# Patient Record
Sex: Female | Born: 1950
Health system: Southern US, Community
[De-identification: ages and names within clinical notes are randomized; demographics above are authoritative.]

## PROBLEM LIST (undated history)

## (undated) DIAGNOSIS — K219 Gastro-esophageal reflux disease without esophagitis: Secondary | ICD-10-CM

## (undated) DIAGNOSIS — E7800 Pure hypercholesterolemia, unspecified: Secondary | ICD-10-CM

## (undated) DIAGNOSIS — M810 Age-related osteoporosis without current pathological fracture: Secondary | ICD-10-CM

## (undated) DIAGNOSIS — H269 Unspecified cataract: Secondary | ICD-10-CM

## (undated) DIAGNOSIS — C187 Malignant neoplasm of sigmoid colon: Secondary | ICD-10-CM

## (undated) DIAGNOSIS — N200 Calculus of kidney: Secondary | ICD-10-CM

## (undated) DIAGNOSIS — M1990 Unspecified osteoarthritis, unspecified site: Secondary | ICD-10-CM

## (undated) DIAGNOSIS — C50919 Malignant neoplasm of unspecified site of unspecified female breast: Secondary | ICD-10-CM

## (undated) DIAGNOSIS — F419 Anxiety disorder, unspecified: Secondary | ICD-10-CM

## (undated) DIAGNOSIS — C189 Malignant neoplasm of colon, unspecified: Secondary | ICD-10-CM

## (undated) DIAGNOSIS — I10 Essential (primary) hypertension: Secondary | ICD-10-CM

## (undated) HISTORY — PX: ABDOMINAL HYSTERECTOMY: SHX81

## (undated) HISTORY — PX: TONSILLECTOMY AND ADENOIDECTOMY: SUR1326

## (undated) HISTORY — PX: COLONOSCOPY: SHX174

## (undated) HISTORY — DX: Essential (primary) hypertension: I10

## (undated) HISTORY — DX: Anxiety disorder, unspecified: F41.9

## (undated) HISTORY — PX: BREAST LUMPECTOMY: SHX2

## (undated) HISTORY — DX: Pure hypercholesterolemia, unspecified: E78.00

## (undated) HISTORY — DX: Age-related osteoporosis without current pathological fracture: M81.0

## (undated) HISTORY — DX: Unspecified osteoarthritis, unspecified site: M19.90

## (undated) HISTORY — DX: Malignant neoplasm of unspecified site of unspecified female breast: C50.919

## (undated) HISTORY — PX: KNEE ARTHROSCOPY: SUR90

## (undated) HISTORY — DX: Unspecified cataract: H26.9

## (undated) HISTORY — PX: POLYPECTOMY: SHX149

## (undated) HISTORY — PX: TONSILECTOMY, ADENOIDECTOMY, BILATERAL MYRINGOTOMY AND TUBES: SHX2538

## (undated) HISTORY — DX: Malignant neoplasm of sigmoid colon: C18.7

## (undated) HISTORY — DX: Calculus of kidney: N20.0

## (undated) HISTORY — DX: Gastro-esophageal reflux disease without esophagitis: K21.9

---
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
## 1998-09-02 DIAGNOSIS — C439 Malignant melanoma of skin, unspecified: Secondary | ICD-10-CM

## 1998-09-02 HISTORY — DX: Malignant melanoma of skin, unspecified: C43.9

## 2001-02-11 ENCOUNTER — Other Ambulatory Visit: Admission: RE | Admit: 2001-02-11 | Discharge: 2001-02-11 | Payer: Self-pay | Admitting: Obstetrics and Gynecology

## 2001-09-02 DIAGNOSIS — C50919 Malignant neoplasm of unspecified site of unspecified female breast: Secondary | ICD-10-CM

## 2001-09-02 DIAGNOSIS — I10 Essential (primary) hypertension: Secondary | ICD-10-CM | POA: Insufficient documentation

## 2001-09-02 HISTORY — DX: Malignant neoplasm of unspecified site of unspecified female breast: C50.919

## 2001-09-02 HISTORY — DX: Essential (primary) hypertension: I10

## 2002-08-04 ENCOUNTER — Encounter: Payer: Self-pay | Admitting: Surgery

## 2002-08-04 ENCOUNTER — Encounter: Admission: RE | Admit: 2002-08-04 | Discharge: 2002-08-04 | Payer: Self-pay | Admitting: Surgery

## 2002-08-06 ENCOUNTER — Encounter: Admission: RE | Admit: 2002-08-06 | Discharge: 2002-08-06 | Payer: Self-pay | Admitting: Surgery

## 2002-08-06 ENCOUNTER — Encounter (INDEPENDENT_AMBULATORY_CARE_PROVIDER_SITE_OTHER): Payer: Self-pay | Admitting: *Deleted

## 2002-08-06 ENCOUNTER — Encounter: Payer: Self-pay | Admitting: Surgery

## 2002-08-06 ENCOUNTER — Ambulatory Visit (HOSPITAL_BASED_OUTPATIENT_CLINIC_OR_DEPARTMENT_OTHER): Admission: RE | Admit: 2002-08-06 | Discharge: 2002-08-06 | Payer: Self-pay | Admitting: Surgery

## 2002-08-17 ENCOUNTER — Ambulatory Visit: Admission: RE | Admit: 2002-08-17 | Discharge: 2002-09-13 | Payer: Self-pay | Admitting: Radiation Oncology

## 2002-11-26 ENCOUNTER — Ambulatory Visit: Admission: RE | Admit: 2002-11-26 | Discharge: 2003-02-16 | Payer: Self-pay | Admitting: Radiation Oncology

## 2003-08-16 ENCOUNTER — Ambulatory Visit: Admission: RE | Admit: 2003-08-16 | Discharge: 2003-08-16 | Payer: Self-pay | Admitting: Radiation Oncology

## 2004-08-02 ENCOUNTER — Ambulatory Visit: Payer: Self-pay | Admitting: Oncology

## 2005-02-22 ENCOUNTER — Ambulatory Visit: Payer: Self-pay | Admitting: Oncology

## 2005-08-09 ENCOUNTER — Ambulatory Visit: Payer: Self-pay | Admitting: Oncology

## 2006-02-03 ENCOUNTER — Ambulatory Visit: Payer: Self-pay | Admitting: Oncology

## 2006-08-14 ENCOUNTER — Ambulatory Visit: Payer: Self-pay | Admitting: Oncology

## 2011-01-18 NOTE — Op Note (Signed)
NAME:  Stephanie Leblanc, Stephanie Leblanc                          ACCOUNT NO.:  [id number]   MEDICAL RECORD NO.:  [id number]                   PATIENT TYPE:  AMB   LOCATION:  DSC                                  FACILITY:  [facility]   PHYSICIAN:  Currie Paris, M.D.           DATE OF BIRTH:  [birth year]   DATE OF PROCEDURE:  DATE OF DISCHARGE:                                 OPERATIVE REPORT   Office MRN [id number]   PREOPERATIVE DIAGNOSIS:  Carcinoma of right breast, lower outer quadrant.   POSTOPERATIVE DIAGNOSIS:  Carcinoma of right breast, lower outer quadrant.   OPERATION:  Needle guided excision of right breast cancer with blue dye  injection and sentinel node dissection.   SURGEON:  Currie Paris, M.D.   ANESTHESIA:  General.   CLINICAL HISTORY:  The patient is a 60 year-old who was recently found to  have a breast cancer by biopsy.  After discussion of alternatives with the  patient, we elected to proceed to needle guided excision followed by  sentinel node dissection using both blue dye and technetium.   DESCRIPTION OF PROCEDURE:  The patient was seen in the holding area and had  no further questions. A guide wire had been placed appropriately and entered  in the lower outer quadrant of the right breast to track medially.  She had  already been injected with her technetium.  She had no further questions  regarding her surgery.   The patient was taken to the operating room and after satisfactory general  endotracheal anesthesia had been obtained the breast was prepped and draped.  Five cc of Lymphazurin blue was injected subareolarly and massaged.   I initially approached the guide wire lumpectomy first and began an incision  in the right lower part of the breast and tracked it towards the nipple  areolar complex which was the general direction of the guide wire although  it seemed to go straight into the breast.  I went around it both inferiorly  and superiorly, then got under  it down to the chest wall and then continued  my tracking it medially toward the subareolar area which was where the mass  turned out to be. I felt I got a little close to the mass along the lower  margin or the inferior margin, so I had to back out and move around a little  bit. I felt the rest of the margins looked fine.  This was a very irregular,  hard area.  She had a lot of dense fibrocystic type changes subareolarly  consistent with some ductal ectasia.  Because I thought one margin might  well be very close and since I had good visualization to where that margin  was, I went ahead and took another area of breast tissue to get a cm more  margin completely around the area where I thought the margin would be close.  Bleeders were  electrocoagulated and once everything appeared to be dry, I  put some Marcaine in.  I put some clips in using four small clips to mark  the periphery and two large clips to mark the anterior and posteriori  margins of the lumpectomy.  The tumor was actually fairly medially located  almost subareolarly.  A pack was placed while waiting for specimen  mammography.   Attention was turned to the axillary area and I was able to immediately  identify a hot area with the Neoprobe and made a transverse incision there.  As soon as I got through the subcutaneous tissue I saw a blue lymphatic and  using the Neoprobe I felt that the node was a little bit higher than that,  so I dissected a little bit more along the track of the lymphatic and  immediately found a large blue, soft node with a little firm node tissue  right adjacent to it and both came out as a single specimen. I was not sure  whether this was just one node or two fused together.  This went to  pathology as a hot blue node.  It had counts around 900 and as soon as that  was removed all the counts in the remaining axillary were down below about  20.  There was no additional palpable adenopathy noted nor any  additional  blue nodes noted.   Both incisions were closed while waiting for pathology using 3-0 Vicryl to  close the subcutaneous at the lumpectomy site plus 4-0 Monocryl  subcuticular.  The axillary site was closed similarly.  I injected some  Marcaine in both sites to help with postoperative analgesia.  I spoke with  Dr. Clelia Croft who felt that the sentinel node was negative on touch preps.  Pathology felt we had the tumor well out and I felt with the additional  margin there really was not more that I would want to do today.  I felt that  any further margin would probably require a mastectomy or certainly more of  a quadrantectomy than what we had done today.  I felt this would be likely  to leave some significant deformity.   The patient tolerated the procedure well.  There were no operative  complications and all counts were correct.                                               Currie Paris, M.D.    CJS/MEDQ  D:  08/06/2002  T:  08/06/2002  Job:  [id number]   cc:   Malachi Pro. Ambrose Mantle, M.D.  [address]  [place]  [place] [address]  Fax: [phone]   Cliffton Asters, M.D.  [address] [place]  [place] [address]  Fax: [phone]   Jeralyn Ruths, M.D.   Leodis Liverpool, M.D.

## 2015-07-20 DIAGNOSIS — Z853 Personal history of malignant neoplasm of breast: Secondary | ICD-10-CM

## 2015-11-08 DIAGNOSIS — L659 Nonscarring hair loss, unspecified: Secondary | ICD-10-CM | POA: Diagnosis not present

## 2015-11-08 DIAGNOSIS — Z1329 Encounter for screening for other suspected endocrine disorder: Secondary | ICD-10-CM | POA: Diagnosis not present

## 2015-11-08 DIAGNOSIS — R5383 Other fatigue: Secondary | ICD-10-CM | POA: Diagnosis not present

## 2015-11-08 DIAGNOSIS — E876 Hypokalemia: Secondary | ICD-10-CM | POA: Diagnosis not present

## 2015-12-21 DIAGNOSIS — I10 Essential (primary) hypertension: Secondary | ICD-10-CM | POA: Diagnosis not present

## 2016-01-11 DIAGNOSIS — L659 Nonscarring hair loss, unspecified: Secondary | ICD-10-CM | POA: Diagnosis not present

## 2016-01-11 DIAGNOSIS — L820 Inflamed seborrheic keratosis: Secondary | ICD-10-CM | POA: Diagnosis not present

## 2016-01-11 DIAGNOSIS — Z08 Encounter for follow-up examination after completed treatment for malignant neoplasm: Secondary | ICD-10-CM | POA: Diagnosis not present

## 2016-01-11 DIAGNOSIS — Z85828 Personal history of other malignant neoplasm of skin: Secondary | ICD-10-CM | POA: Diagnosis not present

## 2016-01-12 DIAGNOSIS — L632 Ophiasis: Secondary | ICD-10-CM | POA: Diagnosis not present

## 2016-03-06 DIAGNOSIS — H1132 Conjunctival hemorrhage, left eye: Secondary | ICD-10-CM | POA: Diagnosis not present

## 2016-04-01 DIAGNOSIS — H5203 Hypermetropia, bilateral: Secondary | ICD-10-CM | POA: Diagnosis not present

## 2016-04-01 DIAGNOSIS — H2513 Age-related nuclear cataract, bilateral: Secondary | ICD-10-CM | POA: Diagnosis not present

## 2016-08-01 DIAGNOSIS — Z853 Personal history of malignant neoplasm of breast: Secondary | ICD-10-CM | POA: Diagnosis not present

## 2016-08-01 DIAGNOSIS — Z1231 Encounter for screening mammogram for malignant neoplasm of breast: Secondary | ICD-10-CM | POA: Diagnosis not present

## 2016-10-03 DIAGNOSIS — Z85820 Personal history of malignant melanoma of skin: Secondary | ICD-10-CM | POA: Diagnosis not present

## 2016-10-03 DIAGNOSIS — Z08 Encounter for follow-up examination after completed treatment for malignant neoplasm: Secondary | ICD-10-CM | POA: Diagnosis not present

## 2016-10-03 DIAGNOSIS — L659 Nonscarring hair loss, unspecified: Secondary | ICD-10-CM | POA: Diagnosis not present

## 2016-12-12 DIAGNOSIS — I10 Essential (primary) hypertension: Secondary | ICD-10-CM | POA: Diagnosis not present

## 2016-12-12 DIAGNOSIS — F341 Dysthymic disorder: Secondary | ICD-10-CM | POA: Diagnosis not present

## 2017-01-31 DIAGNOSIS — I10 Essential (primary) hypertension: Secondary | ICD-10-CM | POA: Diagnosis not present

## 2017-01-31 DIAGNOSIS — F341 Dysthymic disorder: Secondary | ICD-10-CM | POA: Diagnosis not present

## 2017-01-31 DIAGNOSIS — R5383 Other fatigue: Secondary | ICD-10-CM | POA: Diagnosis not present

## 2017-02-26 DIAGNOSIS — Z6827 Body mass index (BMI) 27.0-27.9, adult: Secondary | ICD-10-CM | POA: Diagnosis not present

## 2017-02-26 DIAGNOSIS — J00 Acute nasopharyngitis [common cold]: Secondary | ICD-10-CM | POA: Diagnosis not present

## 2017-02-26 DIAGNOSIS — R05 Cough: Secondary | ICD-10-CM | POA: Diagnosis not present

## 2017-02-26 DIAGNOSIS — R0981 Nasal congestion: Secondary | ICD-10-CM | POA: Diagnosis not present

## 2017-08-15 DIAGNOSIS — Z1231 Encounter for screening mammogram for malignant neoplasm of breast: Secondary | ICD-10-CM | POA: Diagnosis not present

## 2017-08-15 DIAGNOSIS — Z853 Personal history of malignant neoplasm of breast: Secondary | ICD-10-CM | POA: Diagnosis not present

## 2017-08-15 DIAGNOSIS — M8589 Other specified disorders of bone density and structure, multiple sites: Secondary | ICD-10-CM | POA: Diagnosis not present

## 2017-08-15 DIAGNOSIS — R928 Other abnormal and inconclusive findings on diagnostic imaging of breast: Secondary | ICD-10-CM | POA: Diagnosis not present

## 2017-08-30 DIAGNOSIS — J019 Acute sinusitis, unspecified: Secondary | ICD-10-CM | POA: Diagnosis not present

## 2017-09-02 DIAGNOSIS — C189 Malignant neoplasm of colon, unspecified: Secondary | ICD-10-CM | POA: Insufficient documentation

## 2017-09-02 DIAGNOSIS — E7800 Pure hypercholesterolemia, unspecified: Secondary | ICD-10-CM | POA: Insufficient documentation

## 2017-09-02 HISTORY — DX: Pure hypercholesterolemia, unspecified: E78.00

## 2017-09-02 HISTORY — DX: Malignant neoplasm of colon, unspecified: C18.9

## 2017-09-04 DIAGNOSIS — M8580 Other specified disorders of bone density and structure, unspecified site: Secondary | ICD-10-CM | POA: Diagnosis not present

## 2017-09-04 DIAGNOSIS — Z853 Personal history of malignant neoplasm of breast: Secondary | ICD-10-CM | POA: Diagnosis not present

## 2017-10-10 DIAGNOSIS — L814 Other melanin hyperpigmentation: Secondary | ICD-10-CM | POA: Diagnosis not present

## 2017-10-10 DIAGNOSIS — L738 Other specified follicular disorders: Secondary | ICD-10-CM | POA: Diagnosis not present

## 2017-10-10 DIAGNOSIS — D225 Melanocytic nevi of trunk: Secondary | ICD-10-CM | POA: Diagnosis not present

## 2017-10-10 DIAGNOSIS — Z08 Encounter for follow-up examination after completed treatment for malignant neoplasm: Secondary | ICD-10-CM | POA: Diagnosis not present

## 2017-10-10 DIAGNOSIS — Z85820 Personal history of malignant melanoma of skin: Secondary | ICD-10-CM | POA: Diagnosis not present

## 2017-10-10 DIAGNOSIS — L821 Other seborrheic keratosis: Secondary | ICD-10-CM | POA: Diagnosis not present

## 2017-10-10 DIAGNOSIS — Z85828 Personal history of other malignant neoplasm of skin: Secondary | ICD-10-CM | POA: Diagnosis not present

## 2017-10-10 DIAGNOSIS — L820 Inflamed seborrheic keratosis: Secondary | ICD-10-CM | POA: Diagnosis not present

## 2017-11-11 DIAGNOSIS — I10 Essential (primary) hypertension: Secondary | ICD-10-CM | POA: Diagnosis not present

## 2017-11-12 DIAGNOSIS — Z79899 Other long term (current) drug therapy: Secondary | ICD-10-CM | POA: Diagnosis not present

## 2017-11-12 DIAGNOSIS — I10 Essential (primary) hypertension: Secondary | ICD-10-CM | POA: Diagnosis not present

## 2017-11-12 DIAGNOSIS — J9811 Atelectasis: Secondary | ICD-10-CM | POA: Diagnosis not present

## 2017-11-12 DIAGNOSIS — R42 Dizziness and giddiness: Secondary | ICD-10-CM | POA: Diagnosis not present

## 2017-11-13 DIAGNOSIS — I10 Essential (primary) hypertension: Secondary | ICD-10-CM | POA: Diagnosis not present

## 2017-11-13 DIAGNOSIS — Z79899 Other long term (current) drug therapy: Secondary | ICD-10-CM | POA: Diagnosis not present

## 2017-11-13 DIAGNOSIS — J9811 Atelectasis: Secondary | ICD-10-CM | POA: Diagnosis not present

## 2017-11-13 DIAGNOSIS — R42 Dizziness and giddiness: Secondary | ICD-10-CM | POA: Diagnosis not present

## 2017-11-25 DIAGNOSIS — I10 Essential (primary) hypertension: Secondary | ICD-10-CM | POA: Diagnosis not present

## 2017-11-25 DIAGNOSIS — Z6830 Body mass index (BMI) 30.0-30.9, adult: Secondary | ICD-10-CM | POA: Diagnosis not present

## 2017-12-09 DIAGNOSIS — Z1212 Encounter for screening for malignant neoplasm of rectum: Secondary | ICD-10-CM | POA: Diagnosis not present

## 2017-12-09 DIAGNOSIS — Z1211 Encounter for screening for malignant neoplasm of colon: Secondary | ICD-10-CM | POA: Diagnosis not present

## 2017-12-20 LAB — COLOGUARD

## 2017-12-30 ENCOUNTER — Encounter: Payer: Self-pay | Admitting: Physician Assistant

## 2018-01-08 DIAGNOSIS — R69 Illness, unspecified: Secondary | ICD-10-CM | POA: Diagnosis not present

## 2018-01-08 DIAGNOSIS — E785 Hyperlipidemia, unspecified: Secondary | ICD-10-CM | POA: Diagnosis not present

## 2018-01-08 DIAGNOSIS — Z6830 Body mass index (BMI) 30.0-30.9, adult: Secondary | ICD-10-CM | POA: Diagnosis not present

## 2018-01-08 DIAGNOSIS — I10 Essential (primary) hypertension: Secondary | ICD-10-CM | POA: Diagnosis not present

## 2018-01-19 ENCOUNTER — Encounter: Payer: Self-pay | Admitting: *Deleted

## 2018-01-19 ENCOUNTER — Encounter: Payer: Self-pay | Admitting: Physician Assistant

## 2018-01-19 ENCOUNTER — Ambulatory Visit: Payer: Medicare HMO | Admitting: Physician Assistant

## 2018-01-19 VITALS — BP 130/72 | HR 72 | Ht 63.0 in | Wt 174.8 lb

## 2018-01-19 DIAGNOSIS — Z853 Personal history of malignant neoplasm of breast: Secondary | ICD-10-CM

## 2018-01-19 DIAGNOSIS — K219 Gastro-esophageal reflux disease without esophagitis: Secondary | ICD-10-CM | POA: Diagnosis not present

## 2018-01-19 DIAGNOSIS — I10 Essential (primary) hypertension: Secondary | ICD-10-CM

## 2018-01-19 DIAGNOSIS — R195 Other fecal abnormalities: Secondary | ICD-10-CM

## 2018-01-19 HISTORY — DX: Personal history of malignant neoplasm of breast: Z85.3

## 2018-01-19 HISTORY — DX: Essential (primary) hypertension: I10

## 2018-01-19 MED ORDER — PEG 3350-KCL-NA BICARB-NACL 420 G PO SOLR: ORAL | 0 refills | Status: DC

## 2018-01-19 NOTE — Patient Instructions (Signed)
If you are age 68 or older, your body mass index should be between 23-30. Your There is no height or weight on file to calculate BMI. If this is out of the aforementioned range listed, please consider follow up with your Primary Care Provider.  You have been scheduled for an endoscopy and colonoscopy. Please follow the written instructions given to you at your visit today. Please pick up your prep supplies at the pharmacy within the next 1-3 days. [facility], [place], [place] [facility] [place].  If you use inhalers (even only as needed), please bring them with you on the day of your procedure.

## 2018-01-19 NOTE — Progress Notes (Signed)
Subjective:    Patient ID: Stephanie Leblanc, female    DOB: [birth year], 67 y.o.   MRN: [id number]  HPI Stephanie Leblanc is a very nice 67 year old white female, new to GI today referred by Lucita Lora, NP/[facility] for evaluation of positive Cologuard. Patient has not had any prior colonoscopic evaluation.  Family history is positive for polyps, negative for colon cancer. Patient has history of breast cancer diagnosed in 2003 which has been in remission, hypertension, and chronic GERD. She has no current complaints of abdominal pain or discomfort, no changes in bowel habits no melena or hematochezia.  She has been on Protonix 40 mg p.o. daily since 2003 for chronic GERD.  She says generally this controls her symptoms well.  She has no complaints of dysphasia or odynophagia.  She has not had prior EGD.  Review of Systems Pertinent positive and negative review of systems were noted in the above HPI section.  All other review of systems was otherwise negative.  Outpatient Encounter Medications as of 01/19/2018  Medication Sig  . polyethylene glycol-electrolytes (NULYTELY/GOLYTELY) 420 g solution Take as directed for colonoscopy.   No facility-administered encounter medications on file as of 01/19/2018.    No Known Allergies Patient Active Problem List   Diagnosis Date Noted  . Hx of breast cancer 01/19/2018  . HTN (hypertension) 01/19/2018  . GERD (gastroesophageal reflux disease) 01/19/2018   Social History   Socioeconomic History  . Marital status: Single    Spouse name: Not on file  . Number of children: Not on file  . Years of education: Not on file  . Highest education level: Not on file  Occupational History  . Not on file  Social Needs  . Financial resource strain: Not on file  . Food insecurity:    Worry: Not on file    Inability: Not on file  . Transportation needs:    Medical: Not on file    Non-medical: Not on file  Tobacco Use  .  Smoking status: Not on file  Substance and Sexual Activity  . Alcohol use: Not on file  . Drug use: Not on file  . Sexual activity: Not on file  Lifestyle  . Physical activity:    Days per week: Not on file    Minutes per session: Not on file  . Stress: Not on file  Relationships  . Social connections:    Talks on phone: Not on file    Gets together: Not on file    Attends religious service: Not on file    Active member of club or organization: Not on file    Attends meetings of clubs or organizations: Not on file    Relationship status: Not on file  . Intimate partner violence:    Fear of current or ex partner: Not on file    Emotionally abused: Not on file    Physically abused: Not on file    Forced sexual activity: Not on file  Other Topics Concern  . Not on file  Social History Narrative  . Not on file    Stephanie Leblanc's family history includes Diabetes in her mother; Heart disease in her father and mother.      Objective:    Vitals:   01/19/18 1019  BP: 130/72  Pulse: 72    Physical Exam; well-developed older white female in no acute distress, very pleasant accompanied by her husband blood pressure 130/72, pulse 72, height 5 foot 3,  weight 174.  HEENT; nontraumatic normocephalic EOMI PERRLA sclera anicteric, Oropharynx clear, Pulmonary ;clear bilaterally, Cardiovascular; regular rate and rhythm with S1-S2 no murmur rub or gallop, Abdomen; soft, nontender nondistended bowel sounds are active there is no palpable mass or hepatosplenomegaly, Rectal; exam not done, Extremities; no clubbing cyanosis or edema skin warm and dry, Neuro psych; alert and oriented, grossly nonfocal mood and affect appropriate       Assessment & Plan:   #73 67 year old white female referred for positive Cologuard.  She is currently asymptomatic and average risk for colon cancer.  #2.  Personal history of breast cancer 2003/in remission #3.  Hypertension #4.  Chronic GERD-on PPI therapy for  greater than 15 years- rule out Barrett's esophagus  Plan; Patient will be scheduled for colonoscopy and EGD with Dr. Ardis Hughs.  Patient requests being established with Dr. Ardis Hughs as her husband is a patient of Dr. Ardis Hughs.  Procedures were discussed in detail with the patient including indications risks and benefits and she is agreeable to proceed. She will continue Protonix 40 mg p.o. every morning.  Lizet Kelso S Parth Mccormac PA-C 01/19/2018   Cc: Charlynn Court, NP

## 2018-01-20 NOTE — Progress Notes (Signed)
I agree with the above note, plan 

## 2018-02-11 ENCOUNTER — Encounter: Payer: Self-pay | Admitting: Gastroenterology

## 2018-02-24 ENCOUNTER — Other Ambulatory Visit: Payer: Self-pay

## 2018-02-24 ENCOUNTER — Encounter

## 2018-02-24 ENCOUNTER — Ambulatory Visit (AMBULATORY_SURGERY_CENTER): Payer: Medicare HMO | Admitting: Gastroenterology

## 2018-02-24 ENCOUNTER — Encounter: Payer: Self-pay | Admitting: Gastroenterology

## 2018-02-24 VITALS — BP 141/58 | HR 67 | Temp 97.7°F | Resp 14 | Ht 63.0 in | Wt 174.0 lb

## 2018-02-24 DIAGNOSIS — C187 Malignant neoplasm of sigmoid colon: Secondary | ICD-10-CM | POA: Diagnosis not present

## 2018-02-24 DIAGNOSIS — K219 Gastro-esophageal reflux disease without esophagitis: Secondary | ICD-10-CM

## 2018-02-24 DIAGNOSIS — D125 Benign neoplasm of sigmoid colon: Secondary | ICD-10-CM

## 2018-02-24 DIAGNOSIS — R195 Other fecal abnormalities: Secondary | ICD-10-CM

## 2018-02-24 DIAGNOSIS — Z1211 Encounter for screening for malignant neoplasm of colon: Secondary | ICD-10-CM | POA: Diagnosis not present

## 2018-02-24 DIAGNOSIS — K449 Diaphragmatic hernia without obstruction or gangrene: Secondary | ICD-10-CM | POA: Diagnosis not present

## 2018-02-24 MED ORDER — SODIUM CHLORIDE 0.9 % IV SOLN: 500.0000 mL | Freq: Once | INTRAVENOUS | Status: DC

## 2018-02-24 NOTE — Progress Notes (Signed)
Called to room to assist during endoscopic procedure.  Patient ID and intended procedure confirmed with present staff. Received instructions for my participation in the procedure from the performing physician.  

## 2018-02-24 NOTE — Op Note (Signed)
[facility] Patient Name: Stephanie Leblanc Procedure Date: 02/24/2018 2:47 PM MRN: [id number] Endoscopist: Milus Banister , MD Age: 66 Referring MD:  Date of Birth: April 19, 1951 Gender: Female Account #: [id number] Procedure:                Colonoscopy Indications:              Positive Cologuard test Medicines:                Monitored Anesthesia Care Procedure:                Pre-Anesthesia Assessment:                           - Prior to the procedure, a History and Physical                            was performed, and patient medications and                            allergies were reviewed. The patient's tolerance of                            previous anesthesia was also reviewed. The risks                            and benefits of the procedure and the sedation                            options and risks were discussed with the patient.                            All questions were answered, and informed consent                            was obtained. Prior Anticoagulants: The patient has                            taken no previous anticoagulant or antiplatelet                            agents. ASA Grade Assessment: II - A patient with                            mild systemic disease. After reviewing the risks                            and benefits, the patient was deemed in                            satisfactory condition to undergo the procedure.                           After obtaining informed consent, the colonoscope  was passed under direct vision. Throughout the                            procedure, the patient's blood pressure, pulse, and                            oxygen saturations were monitored continuously. The                            Colonoscope was introduced through the anus and                            advanced to the the cecum, identified by                            appendiceal orifice and ileocecal valve. The                             colonoscopy was performed without difficulty. The                            patient tolerated the procedure well. The quality                            of the bowel preparation was good. The ileocecal                            valve, appendiceal orifice, and rectum were                            photographed. Scope In: 2:53:16 PM Scope Out: 3:05:24 PM Scope Withdrawal Time: 0 hours 8 minutes 19 seconds  Total Procedure Duration: 0 hours 12 minutes 8 seconds  Findings:                 A 12 mm polyp was found in the sigmoid colon. The                            polyp was semi-pedunculated. The polyp was removed                            with a hot snare. Resection and retrieval were                            complete.                           The exam was otherwise without abnormality on                            direct and retroflexion views. Complications:            No immediate complications. Estimated blood loss:  None. Estimated Blood Loss:     Estimated blood loss: none. Impression:               - One 12 mm polyp in the sigmoid colon, removed                            with a hot snare. Resected and retrieved.                           - The examination was otherwise normal on direct                            and retroflexion views. Recommendation:           - Patient has a contact number available for                            emergencies. The signs and symptoms of potential                            delayed complications were discussed with the                            patient. Return to normal activities tomorrow.                            Written discharge instructions were provided to the                            patient.                           - Resume previous diet.                           - Continue present medications.                           You will receive a letter within 2-3 weeks with the                             pathology results and my final recommendations.                           If the polyp(s) is proven to be 'pre-cancerous' on                            pathology, you will need repeat colonoscopy in 3                            years. If the polyp(s) is NOT 'precancerous' on                            pathology then you should repeat colon cancer  screening in 10 years with colonoscopy without need                            for colon cancer screening by any method prior to                            then (including stool testing). Milus Banister, MD 02/24/2018 3:07:09 PM This report has been signed electronically.

## 2018-02-24 NOTE — Op Note (Signed)
[facility] Patient Name: Stephanie Leblanc Procedure Date: 02/24/2018 2:47 PM MRN: [id number] Endoscopist: Milus Banister , MD Age: 67 Referring MD:  Date of Birth: [birth year] Gender: Female Account #: [id number] Procedure:                Upper GI endoscopy Indications:              Heartburn Medicines:                Monitored Anesthesia Care Procedure:                Pre-Anesthesia Assessment:                           - Prior to the procedure, a History and Physical                            was performed, and patient medications and                            allergies were reviewed. The patient's tolerance of                            previous anesthesia was also reviewed. The risks                            and benefits of the procedure and the sedation                            options and risks were discussed with the patient.                            All questions were answered, and informed consent                            was obtained. Prior Anticoagulants: The patient has                            taken no previous anticoagulant or antiplatelet                            agents. ASA Grade Assessment: II - A patient with                            mild systemic disease. After reviewing the risks                            and benefits, the patient was deemed in                            satisfactory condition to undergo the procedure.                           After obtaining informed consent, the endoscope was  passed under direct vision. Throughout the                            procedure, the patient's blood pressure, pulse, and                            oxygen saturations were monitored continuously. The                            Endoscope was introduced through the mouth, and                            advanced to the second part of duodenum. The upper                            GI endoscopy was accomplished without  difficulty.                            The patient tolerated the procedure well. Scope In: Scope Out: Findings:                 The esophagus was normal.                           There was a small hiatal hernia.                           The examined duodenum was normal. Complications:            No immediate complications. Estimated blood loss:                            None. Estimated Blood Loss:     Estimated blood loss: none. Impression:               - Small hiatal hernia, otherwise the examination                            was normal. Recommendation:           - Patient has a contact number available for                            emergencies. The signs and symptoms of potential                            delayed complications were discussed with the                            patient. Return to normal activities tomorrow.                            Written discharge instructions were provided to the                            patient.                           -  Resume previous diet.                           - Continue present medications. Continue protonix                            one pill once daily for your non-erosive acid                            reflux. Milus Banister, MD 02/24/2018 3:14:18 PM This report has been signed electronically.

## 2018-02-24 NOTE — Progress Notes (Signed)
A and O x3. Report to RN. Tolerated MAC anesthesia well.Teeth unchanged after procedure.

## 2018-02-24 NOTE — Patient Instructions (Signed)
Colon polyp x 1 removed today. Hiatal hernia seen.  Handout given on polyps,GERD. Result letter in your mail in 2-3 weeks.  Please hold aspirin, NSAIDS, anti-inflammatory, Ibuprofen, Aleve, etc..medications for 2 weeks. Call us with any questions or concerns. Thank you!    YOU HAD AN ENDOSCOPIC PROCEDURE TODAY AT [facility]:   Refer to the procedure report that was given to you for any specific questions about what was found during the examination.  If the procedure report does not answer your questions, please call your gastroenterologist to clarify.  If you requested that your care partner not be given the details of your procedure findings, then the procedure report has been included in a sealed envelope for you to review at your convenience later.  YOU SHOULD EXPECT: Some feelings of bloating in the abdomen. Passage of more gas than usual.  Walking can help get rid of the air that was put into your GI tract during the procedure and reduce the bloating. If you had a lower endoscopy (such as a colonoscopy or flexible sigmoidoscopy) you may notice spotting of blood in your stool or on the toilet paper. If you underwent a bowel prep for your procedure, you may not have a normal bowel movement for a few days.  Please Note:  You might notice some irritation and congestion in your nose or some drainage.  This is from the oxygen used during your procedure.  There is no need for concern and it should clear up in a day or so.  SYMPTOMS TO REPORT IMMEDIATELY:   Following lower endoscopy (colonoscopy or flexible sigmoidoscopy):  Excessive amounts of blood in the stool  Significant tenderness or worsening of abdominal pains  Swelling of the abdomen that is new, acute  Fever of 100F or higher   Following upper endoscopy (EGD)  Vomiting of blood or coffee ground material  New chest pain or pain under the shoulder blades  Painful or persistently difficult swallowing  New shortness of  breath  Fever of 100F or higher  Black, tarry-looking stools  For urgent or emergent issues, a gastroenterologist can be reached at any hour by calling [phone].   DIET:  We do recommend a small meal at first, but then you may proceed to your regular diet.  Drink plenty of fluids but you should avoid alcoholic beverages for 24 hours.  ACTIVITY:  You should plan to take it easy for the rest of today and you should NOT DRIVE or use heavy machinery until tomorrow (because of the sedation medicines used during the test).    FOLLOW UP: Our staff will call the number listed on your records the next business day following your procedure to check on you and address any questions or concerns that you may have regarding the information given to you following your procedure. If we do not reach you, we will leave a message.  However, if you are feeling well and you are not experiencing any problems, there is no need to return our call.  We will assume that you have returned to your regular daily activities without incident.  If any biopsies were taken you will be contacted by phone or by letter within the next 1-3 weeks.  Please call us at [phone] if you have not heard about the biopsies in 3 weeks.    SIGNATURES/CONFIDENTIALITY: You and/or your care partner have signed paperwork which will be entered into your electronic medical record.  These signatures attest to  the fact that that the information above on your After Visit Summary has been reviewed and is understood.  Full responsibility of the confidentiality of this discharge information lies with you and/or your care-partner.

## 2018-02-25 ENCOUNTER — Telehealth: Payer: Self-pay

## 2018-02-25 NOTE — Telephone Encounter (Signed)
  Follow up Call-  Call back number 02/24/2018  Post procedure Call Back phone  # [phone]  Permission to leave phone message Yes  Some recent data might be hidden     Patient questions:  Do you have a fever, pain , or abdominal swelling? No. Pain Score  0 *  Have you tolerated food without any problems? Yes.    Have you been able to return to your normal activities? Yes.    Do you have any questions about your discharge instructions: Diet   No. Medications  No. Follow up visit  No.  Do you have questions or concerns about your Care? No.  Actions: * If pain score is 4 or above: No action needed, pain <4.

## 2018-03-06 ENCOUNTER — Other Ambulatory Visit: Payer: Self-pay

## 2018-03-06 DIAGNOSIS — R195 Other fecal abnormalities: Secondary | ICD-10-CM

## 2018-03-06 DIAGNOSIS — C189 Malignant neoplasm of colon, unspecified: Secondary | ICD-10-CM

## 2018-03-09 ENCOUNTER — Other Ambulatory Visit (INDEPENDENT_AMBULATORY_CARE_PROVIDER_SITE_OTHER): Payer: Medicare HMO

## 2018-03-09 DIAGNOSIS — R195 Other fecal abnormalities: Secondary | ICD-10-CM | POA: Diagnosis not present

## 2018-03-09 LAB — CREATININE, SERUM: Creatinine, Ser: 0.7 mg/dL (ref 0.40–1.20)

## 2018-03-09 LAB — BUN: BUN: 19 mg/dL (ref 6–23)

## 2018-03-10 ENCOUNTER — Ambulatory Visit (INDEPENDENT_AMBULATORY_CARE_PROVIDER_SITE_OTHER)
Admission: RE | Admit: 2018-03-10 | Discharge: 2018-03-10 | Disposition: A | Discharge status: Home / Self Care | Payer: Medicare HMO | Source: Ambulatory Visit | Attending: Gastroenterology | Admitting: Gastroenterology

## 2018-03-10 DIAGNOSIS — R195 Other fecal abnormalities: Secondary | ICD-10-CM | POA: Diagnosis not present

## 2018-03-10 DIAGNOSIS — R911 Solitary pulmonary nodule: Secondary | ICD-10-CM | POA: Diagnosis not present

## 2018-03-10 DIAGNOSIS — C189 Malignant neoplasm of colon, unspecified: Secondary | ICD-10-CM | POA: Diagnosis not present

## 2018-03-10 MED ORDER — IOPAMIDOL (ISOVUE-300) INJECTION 61%
100.0000 mL | Freq: Once | INTRAVENOUS | Status: AC | PRN
  Administered 2018-03-10: 100 mL via INTRAVENOUS

## 2018-03-11 LAB — CEA: CEA: 4.5 ng/mL — ABNORMAL HIGH

## 2018-03-12 ENCOUNTER — Encounter (HOSPITAL_COMMUNITY): Payer: Self-pay | Admitting: *Deleted

## 2018-03-12 ENCOUNTER — Other Ambulatory Visit: Payer: Self-pay

## 2018-03-12 ENCOUNTER — Ambulatory Visit (HOSPITAL_COMMUNITY)
Admission: RE | Admit: 2018-03-12 | Discharge: 2018-03-12 | Disposition: A | Discharge status: Home / Self Care | Payer: Medicare HMO | Source: Ambulatory Visit | Attending: Gastroenterology | Admitting: Gastroenterology

## 2018-03-12 ENCOUNTER — Encounter (HOSPITAL_COMMUNITY)
Admission: RE | Disposition: A | Discharge status: Home / Self Care | Payer: Self-pay | Source: Ambulatory Visit | Attending: Gastroenterology

## 2018-03-12 DIAGNOSIS — Z85038 Personal history of other malignant neoplasm of large intestine: Secondary | ICD-10-CM

## 2018-03-12 DIAGNOSIS — Z8249 Family history of ischemic heart disease and other diseases of the circulatory system: Secondary | ICD-10-CM | POA: Diagnosis not present

## 2018-03-12 DIAGNOSIS — R195 Other fecal abnormalities: Secondary | ICD-10-CM

## 2018-03-12 DIAGNOSIS — K219 Gastro-esophageal reflux disease without esophagitis: Secondary | ICD-10-CM | POA: Diagnosis not present

## 2018-03-12 DIAGNOSIS — Z08 Encounter for follow-up examination after completed treatment for malignant neoplasm: Secondary | ICD-10-CM | POA: Diagnosis not present

## 2018-03-12 DIAGNOSIS — Z888 Allergy status to other drugs, medicaments and biological substances status: Secondary | ICD-10-CM | POA: Insufficient documentation

## 2018-03-12 DIAGNOSIS — Z87442 Personal history of urinary calculi: Secondary | ICD-10-CM | POA: Insufficient documentation

## 2018-03-12 DIAGNOSIS — Z853 Personal history of malignant neoplasm of breast: Secondary | ICD-10-CM | POA: Diagnosis not present

## 2018-03-12 DIAGNOSIS — R911 Solitary pulmonary nodule: Secondary | ICD-10-CM

## 2018-03-12 DIAGNOSIS — M1712 Unilateral primary osteoarthritis, left knee: Secondary | ICD-10-CM | POA: Diagnosis not present

## 2018-03-12 DIAGNOSIS — I10 Essential (primary) hypertension: Secondary | ICD-10-CM | POA: Diagnosis not present

## 2018-03-12 DIAGNOSIS — R69 Illness, unspecified: Secondary | ICD-10-CM | POA: Diagnosis not present

## 2018-03-12 DIAGNOSIS — F419 Anxiety disorder, unspecified: Secondary | ICD-10-CM | POA: Insufficient documentation

## 2018-03-12 DIAGNOSIS — C187 Malignant neoplasm of sigmoid colon: Secondary | ICD-10-CM

## 2018-03-12 DIAGNOSIS — C189 Malignant neoplasm of colon, unspecified: Secondary | ICD-10-CM

## 2018-03-12 DIAGNOSIS — E7800 Pure hypercholesterolemia, unspecified: Secondary | ICD-10-CM | POA: Insufficient documentation

## 2018-03-12 HISTORY — PX: FLEXIBLE SIGMOIDOSCOPY: SHX5431

## 2018-03-12 SURGERY — SIGMOIDOSCOPY, FLEXIBLE
Anesthesia: Moderate Sedation

## 2018-03-12 MED ORDER — SODIUM CHLORIDE 0.9 % IV SOLN
INTRAVENOUS | Status: DC
  Administered 2018-03-12: 10:00:00 via INTRAVENOUS

## 2018-03-12 MED ORDER — FENTANYL CITRATE (PF) 100 MCG/2ML IJ SOLN
INTRAMUSCULAR | Status: DC | PRN
  Administered 2018-03-12: 25 ug via INTRAVENOUS
  Administered 2018-03-12: 50 ug via INTRAVENOUS
  Administered 2018-03-12: 25 ug via INTRAVENOUS

## 2018-03-12 MED ORDER — MIDAZOLAM HCL 5 MG/ML IJ SOLN
INTRAMUSCULAR | Status: AC
  Filled 2018-03-12: qty 2

## 2018-03-12 MED ORDER — FENTANYL CITRATE (PF) 100 MCG/2ML IJ SOLN
INTRAMUSCULAR | Status: AC
  Filled 2018-03-12: qty 2

## 2018-03-12 MED ORDER — MIDAZOLAM HCL 10 MG/2ML IJ SOLN
INTRAMUSCULAR | Status: DC | PRN
  Administered 2018-03-12 (×3): 2 mg via INTRAVENOUS
  Administered 2018-03-12 (×2): 1 mg via INTRAVENOUS

## 2018-03-12 NOTE — Op Note (Addendum)
[facility] Patient Name: Stephanie Leblanc Procedure Date: 03/12/2018 MRN: [id number] Attending MD: Milus Banister , MD Date of Birth: 09/03/1950 CSN: [id number] Age: 67 Admit Type: Outpatient Procedure:                Flexible Sigmoidoscopy Indications:              High risk colon cancer surveillance: Personal                            history of colon cancer; very early stage colon                            cancer in a sigmoid polyp removed with                            snare/cautery 2 weeks ago; favorable pathology                            findings, CT scan without obvious metastatic                            disease, CEA 4.5; case was discussed at 03/2018 GI                            cancer conference. Providers:                Milus Banister, MD, Cleda Daub, RN, William Dalton, Technician Referring MD:              Medicines:                Fentanyl 100 micrograms IV, Midazolam 8 mg IV Complications:            No immediate complications. Estimated blood loss:                            None. Estimated Blood Loss:     Estimated blood loss: none. Procedure:                Pre-Anesthesia Assessment:                           - Prior to the procedure, a History and Physical                            was performed, and patient medications and                            allergies were reviewed. The patient's tolerance of                            previous anesthesia was also reviewed. The risks                            and  benefits of the procedure and the sedation                            options and risks were discussed with the patient.                            All questions were answered, and informed consent                            was obtained. Prior Anticoagulants: The patient has                            taken no previous anticoagulant or antiplatelet                            agents. ASA Grade Assessment:  II - A patient with                            mild systemic disease. After reviewing the risks                            and benefits, the patient was deemed in                            satisfactory condition to undergo the procedure.                           After obtaining informed consent, the scope was                            passed under direct vision. The EC-[id number] ([id number])                            scope was introduced through the anus and advanced                            to the the splenic flexure. The flexible                            sigmoidoscopy was accomplished without difficulty.                            The patient tolerated the procedure well. The                            quality of the bowel preparation was good. Scope In: 10:18:28 AM Scope Out: 10:31:25 AM Total Procedure Duration: 0 hours 12 minutes 57 seconds  Findings:      The entire examined colon appeared normal.      The site of 02/24/2018 sigmoid polypectomy was not visible on this exam. Impression:               - Normal sigmoidoscopy to the splenic flexure.                           -  The site of 02/24/2018 sigmoid polypectomy was not                            visible on this exam. Moderate Sedation:      Moderate (conscious) sedation was administered by the endoscopy nurse       and supervised by the endoscopist. The patient's oxygen saturation,       heart rate, blood pressure and response to care were monitored. Total       physician intraservice time was 22 minutes. Recommendation:           - Discharge patient to home (ambulatory).                           - Repeat flexible sigmoidoscopy in 6 months with                            repeat CEA level a week or so prior. Procedure Code(s):        --- Professional ---                           [phone], Sigmoidoscopy, flexible; diagnostic,                            including collection of specimen(s) by brushing or                             washing, when performed (separate procedure)                           G0500, Moderate sedation services provided by the                            same physician or other qualified health care                            professional performing a gastrointestinal                            endoscopic service that sedation supports,                            requiring the presence of an independent trained                            observer to assist in the monitoring of the                            patient's level of consciousness and physiological                            status; initial 15 minutes of intra-service time;                            patient age 71 years or older (additional time 74  be reported with [phone], as appropriate) Diagnosis Code(s):        --- Professional ---                           [phone], Personal history of other malignant                            neoplasm of large intestine CPT copyright 2017 American Medical Association. All rights reserved. The codes documented in this report are preliminary and upon coder review may  be revised to meet current compliance requirements. Milus Banister, MD 03/12/2018 10:36:02 AM This report has been signed electronically. Number of Addenda: 0

## 2018-03-12 NOTE — H&P (Signed)
HPI: This is a very pleasant 67yo woman  Chief complaint is cancer in a polyp, apperently completely resected  ROS: complete GI ROS as described in HPI, all other review negative.  Constitutional:  No unintentional weight loss   Past Medical History:  Diagnosis Date  . Anxiety   . Arthritis    knee left  . Breast cancer ([place]) 2003  . Elevated cholesterol 2019  . GERD (gastroesophageal reflux disease)   . Hypertension 2003  . Kidney stones    2 stones in the past    Past Surgical History:  Procedure Laterality Date  . ABDOMINAL HYSTERECTOMY    . BREAST LUMPECTOMY     right breast  . CESAREAN SECTION     x2  . KNEE ARTHROSCOPY     left knee  . TONSILECTOMY, ADENOIDECTOMY, BILATERAL MYRINGOTOMY AND TUBES      Current Facility-Administered Medications  Medication Dose Route Frequency Provider Last Rate Last Dose  . 0.9 %  sodium chloride infusion   Intravenous Continuous Milus Banister, MD        Allergies as of 03/06/2018 - Review Complete 02/24/2018  Allergen Reaction Noted  . Compazine [prochlorperazine edisylate]  02/24/2018    Family History  Problem Relation Age of Onset  . Heart disease Mother   . Diabetes Mother   . Heart disease Father   . Colon cancer Neg Hx        No colon cancer in family.    Social History   Socioeconomic History  . Marital status: Single    Spouse name: Not on file  . Number of children: Not on file  . Years of education: Not on file  . Highest education level: Not on file  Occupational History  . Not on file  Social Needs  . Financial resource strain: Not on file  . Food insecurity:    Worry: Not on file    Inability: Not on file  . Transportation needs:    Medical: Not on file    Non-medical: Not on file  Tobacco Use  . Smoking status: Never Smoker  . Smokeless tobacco: Never Used  Substance and Sexual Activity  . Alcohol use: Never    Frequency: Never  . Drug use: Never  . Sexual activity: Not on file   Lifestyle  . Physical activity:    Days per week: Not on file    Minutes per session: Not on file  . Stress: Not on file  Relationships  . Social connections:    Talks on phone: Not on file    Gets together: Not on file    Attends religious service: Not on file    Active member of club or organization: Not on file    Attends meetings of clubs or organizations: Not on file    Relationship status: Not on file  . Intimate partner violence:    Fear of current or ex partner: Not on file    Emotionally abused: Not on file    Physically abused: Not on file    Forced sexual activity: Not on file  Other Topics Concern  . Not on file  Social History Narrative  . Not on file     Physical Exam: BP (!) 160/70   Pulse 80   Temp 98.5 F (36.9 C) (Oral)   Resp 19   Ht 5\' 3"  (1.6 m)   Wt 174 lb (78.9 kg)   SpO2 95%   BMI 30.82 kg/m  Constitutional:  generally well-appearing Psychiatric: alert and oriented x3 Abdomen: soft, nontender, nondistended, no obvious ascites, no peritoneal signs, normal bowel sounds No peripheral edema noted in lower extremities  Assessment and plan: 67 y.o. female with cancer in a colon polyp  For flex sig this mornign with biopsy and tattooing.  Please see the "Patient Instructions" section for addition details about the plan.  Nordby Loffler, MD [facility] 03/12/2018, 9:52 AM

## 2018-03-12 NOTE — Discharge Instructions (Signed)
YOU HAD AN ENDOSCOPIC PROCEDURE TODAY: Refer to the procedure report and other information in the discharge instructions given to you for any specific questions about what was found during the examination. If this information does not answer your questions, please call [place] office at [phone] to clarify.  ° °YOU SHOULD EXPECT: Some feelings of bloating in the abdomen. Passage of more gas than usual. Walking can help get rid of the air that was put into your GI tract during the procedure and reduce the bloating. If you had a lower endoscopy (such as a colonoscopy or flexible sigmoidoscopy) you may notice spotting of blood in your stool or on the toilet paper. Some abdominal soreness may be present for a day or two, also. ° °DIET: Your first meal following the procedure should be a light meal and then it is ok to progress to your normal diet. A half-sandwich or bowl of soup is an example of a good first meal. Heavy or fried foods are harder to digest and may make you feel nauseous or bloated. Drink plenty of fluids but you should avoid alcoholic beverages for 24 hours. If you had a esophageal dilation, please see attached instructions for diet.   ° °ACTIVITY: Your care partner should take you home directly after the procedure. You should plan to take it easy, moving slowly for the rest of the day. You can resume normal activity the day after the procedure however YOU SHOULD NOT DRIVE, use power tools, machinery or perform tasks that involve climbing or major physical exertion for 24 hours (because of the sedation medicines used during the test).  ° °SYMPTOMS TO REPORT IMMEDIATELY: °A gastroenterologist can be reached at any hour. Please call [phone]  for any of the following symptoms:  °Following lower endoscopy (colonoscopy, flexible sigmoidoscopy) °Excessive amounts of blood in the stool  °Significant tenderness, worsening of abdominal pains  °Swelling of the abdomen that is new, acute  °Fever of 100° or  higher  °Following upper endoscopy (EGD, EUS, ERCP, esophageal dilation) °Vomiting of blood or coffee ground material  °New, significant abdominal pain  °New, significant chest pain or pain under the shoulder blades  °Painful or persistently difficult swallowing  °New shortness of breath  °Black, tarry-looking or red, bloody stools ° °FOLLOW UP:  °If any biopsies were taken you will be contacted by phone or by letter within the next 1-3 weeks. Call [phone]  if you have not heard about the biopsies in 3 weeks.  °Please also call with any specific questions about appointments or follow up tests. ° °

## 2018-03-13 ENCOUNTER — Encounter (HOSPITAL_COMMUNITY): Payer: Self-pay | Admitting: Gastroenterology

## 2018-03-31 DIAGNOSIS — E785 Hyperlipidemia, unspecified: Secondary | ICD-10-CM | POA: Diagnosis not present

## 2018-03-31 DIAGNOSIS — I10 Essential (primary) hypertension: Secondary | ICD-10-CM | POA: Diagnosis not present

## 2018-04-03 DIAGNOSIS — M1712 Unilateral primary osteoarthritis, left knee: Secondary | ICD-10-CM | POA: Diagnosis not present

## 2018-04-03 DIAGNOSIS — M23332 Other meniscus derangements, other medial meniscus, left knee: Secondary | ICD-10-CM | POA: Diagnosis not present

## 2018-04-03 DIAGNOSIS — M23322 Other meniscus derangements, posterior horn of medial meniscus, left knee: Secondary | ICD-10-CM | POA: Diagnosis not present

## 2018-04-03 DIAGNOSIS — M7652 Patellar tendinitis, left knee: Secondary | ICD-10-CM | POA: Diagnosis not present

## 2018-04-13 DIAGNOSIS — R69 Illness, unspecified: Secondary | ICD-10-CM | POA: Diagnosis not present

## 2018-04-13 DIAGNOSIS — E785 Hyperlipidemia, unspecified: Secondary | ICD-10-CM | POA: Diagnosis not present

## 2018-04-13 DIAGNOSIS — I10 Essential (primary) hypertension: Secondary | ICD-10-CM | POA: Diagnosis not present

## 2018-04-23 ENCOUNTER — Encounter: Payer: Self-pay | Admitting: Emergency Medicine

## 2018-04-23 ENCOUNTER — Ambulatory Visit: Payer: Medicare HMO | Admitting: Emergency Medicine

## 2018-04-23 DIAGNOSIS — R918 Other nonspecific abnormal finding of lung field: Secondary | ICD-10-CM

## 2018-04-23 DIAGNOSIS — R9389 Abnormal findings on diagnostic imaging of other specified body structures: Secondary | ICD-10-CM | POA: Insufficient documentation

## 2018-04-23 HISTORY — DX: Abnormal findings on diagnostic imaging of other specified body structures: R93.89

## 2018-04-23 NOTE — Progress Notes (Signed)
Subjective:    Patient ID: Stephanie Leblanc, female    DOB: 1950/09/18, 67 y.o.   MRN: [id number]  HPI 67 year old never smoker with a history of right breast cancer that was treated w surgery + chemoradiation 2003, remote melanoma that was surgically 1997, GERD, hypertension, hyperlipidemia.  She has a new diagnosis of colon cancer that was made on a polyp, by Dr. Ardis Hughs by colonoscopy. CT abdomen / chest did not show a mass or any LAD.  There was an isolated 3 mm pulmonary nodule noted in the right upper lobe.  Also some very small areas of pleural fluid/thickening bilaterally.  She denies any cough, wheeze, chest pain.  She does occasionally get exertional shortness of breath when she climbs stairs.   Review of Systems  Constitutional: Negative for fever and unexpected weight change.  HENT: Positive for dental problem. Negative for congestion, ear pain, nosebleeds, postnasal drip, rhinorrhea, sinus pressure, sneezing, sore throat and trouble swallowing.   Eyes: Negative for redness and itching.  Respiratory: Positive for shortness of breath. Negative for cough, chest tightness and wheezing.   Cardiovascular: Negative for palpitations and leg swelling.  Gastrointestinal: Negative for nausea and vomiting.  Genitourinary: Negative for dysuria.  Musculoskeletal: Negative for joint swelling.  Skin: Negative for rash.  Neurological: Negative for headaches.  Hematological: Does not bruise/bleed easily.  Psychiatric/Behavioral: Negative for dysphoric mood. The patient is not nervous/anxious.     Past Medical History:  Diagnosis Date  . Anxiety   . Arthritis    knee left  . Breast cancer ([place]) 2003  . Elevated cholesterol 2019  . GERD (gastroesophageal reflux disease)   . Hypertension 2003  . Kidney stones    2 stones in the past     Family History  Problem Relation Age of Onset  . Heart disease Mother   . Diabetes Mother   . Heart disease Father   . Colon cancer Neg Hx       No colon cancer in family.     Social History   Socioeconomic History  . Marital status: Single    Spouse name: Not on file  . Number of children: Not on file  . Years of education: Not on file  . Highest education level: Not on file  Occupational History  . Not on file  Social Needs  . Financial resource strain: Not on file  . Food insecurity:    Worry: Not on file    Inability: Not on file  . Transportation needs:    Medical: Not on file    Non-medical: Not on file  Tobacco Use  . Smoking status: Never Smoker  . Smokeless tobacco: Never Used  Substance and Sexual Activity  . Alcohol use: Never    Frequency: Never  . Drug use: Never  . Sexual activity: Not on file  Lifestyle  . Physical activity:    Days per week: Not on file    Minutes per session: Not on file  . Stress: Not on file  Relationships  . Social connections:    Talks on phone: Not on file    Gets together: Not on file    Attends religious service: Not on file    Active member of club or organization: Not on file    Attends meetings of clubs or organizations: Not on file    Relationship status: Not on file  . Intimate partner violence:    Fear of current or ex partner: Not on file  Emotionally abused: Not on file    Physically abused: Not on file    Forced sexual activity: Not on file  Other Topics Concern  . Not on file  Social History Narrative  . Not on file   She is from [place], no real exposures.  Office worker Owns a [employer]  Allergies  Allergen Reactions  . Compazine [Prochlorperazine Edisylate]     Muscle contractions     Outpatient Medications Prior to Visit  Medication Sig Dispense Refill  . acetaminophen (TYLENOL) 500 MG tablet Take 1,000 mg by mouth every 8 (eight) hours as needed for moderate pain.    Marland Kitchen aspirin 81 MG chewable tablet Chew 81 mg by mouth daily.    . Calcium Carbonate-Vit D-Min (CALCIUM 1200 PO) Take 1,200 mg by mouth daily.     . cholecalciferol (VITAMIN D)  1000 units tablet Take 1,000 Units by mouth daily.    . pantoprazole (PROTONIX) 40 MG tablet Take 40 mg by mouth daily.    . rosuvastatin (CRESTOR) 10 MG tablet Take 10 mg by mouth daily.    . sertraline (ZOLOFT) 50 MG tablet Take 50 mg by mouth daily.    Marland Kitchen spironolactone (ALDACTONE) 25 MG tablet Take 25 mg by mouth daily.     . valsartan (DIOVAN) 320 MG tablet Take 320 mg by mouth daily.      No facility-administered medications prior to visit.         Objective:   Physical Exam Vitals:   04/23/18 1356  BP: (!) 154/66  Pulse: 100  SpO2: 95%  Weight: 175 lb (79.4 kg)  Height: 5' 4.5" (1.638 m)   Gen: Pleasant, well-nourished, in no distress,  normal affect  ENT: No lesions,  mouth clear,  oropharynx clear, no postnasal drip  Neck: No JVD, no stridor  Lungs: No use of accessory muscles, no wheeze or crackles  Cardiovascular: RRR, heart sounds normal, no murmur or gallops, no peripheral edema  Musculoskeletal: No deformities, no cyanosis or clubbing  Neuro: alert, non focal  Skin: Warm, no lesions or rash      Assessment & Plan:  Abnormal CT of the chest Newly discovered 3 mm right upper lobe nodule and a never smoker.  Under typical circumstances this will be characterized as low risk and no repeat imaging would be indicated.  She does have a history of breast cancer and melanoma (both remote) and also newly discovered colon cancer found on polypectomy/colonoscopy.  The right upper lobe nodule is very inconsistent with metastatic disease but this is possible.  She also has some areas of focal pleural fluid versus pleural thickening of unclear etiology.  I believe she needs surveillance scans, next in 12 months.  If she is found on her surveillance colonoscopy to have active colon cancer then I would favor repeating the scan sooner.  We will coordinate with Dr. Ardis Hughs if this is the case.   Baltazar Apo, MD, PhD 04/23/2018, 2:24 PM [facility] [phone] or if no answer [phone]

## 2018-04-23 NOTE — Patient Instructions (Signed)
We will plan to repeat your CT scan of the chest without contrast in July 2020 to follow your 3 mm pulmonary nodule. If there is new evidence for a colon abnormality on your evaluation with Dr. Ardis Hughs as you go forward then we may decide to repeat your CT scan sooner.  We will follow with Dr. Ardis Hughs and make these plans if indicated. Follow with Dr Lamonte Sakai in 12 months to follow the CT scan together, or sooner if you have any problems

## 2018-04-23 NOTE — Assessment & Plan Note (Signed)
Newly discovered 3 mm right upper lobe nodule and a never smoker.  Under typical circumstances this will be characterized as low risk and no repeat imaging would be indicated.  She does have a history of breast cancer and melanoma (both remote) and also newly discovered colon cancer found on polypectomy/colonoscopy.  The right upper lobe nodule is very inconsistent with metastatic disease but this is possible.  She also has some areas of focal pleural fluid versus pleural thickening of unclear etiology.  I believe she needs surveillance scans, next in 12 months.  If she is found on her surveillance colonoscopy to have active colon cancer then I would favor repeating the scan sooner.  We will coordinate with Dr. Ardis Hughs if this is the case.

## 2018-07-09 DIAGNOSIS — E785 Hyperlipidemia, unspecified: Secondary | ICD-10-CM | POA: Diagnosis not present

## 2018-07-09 DIAGNOSIS — R69 Illness, unspecified: Secondary | ICD-10-CM | POA: Diagnosis not present

## 2018-07-09 DIAGNOSIS — Z23 Encounter for immunization: Secondary | ICD-10-CM | POA: Diagnosis not present

## 2018-07-09 DIAGNOSIS — I10 Essential (primary) hypertension: Secondary | ICD-10-CM | POA: Diagnosis not present

## 2018-08-24 DIAGNOSIS — Z1231 Encounter for screening mammogram for malignant neoplasm of breast: Secondary | ICD-10-CM | POA: Diagnosis not present

## 2018-08-24 DIAGNOSIS — Z853 Personal history of malignant neoplasm of breast: Secondary | ICD-10-CM | POA: Diagnosis not present

## 2018-09-10 ENCOUNTER — Encounter: Payer: Self-pay | Admitting: Gastroenterology

## 2018-10-15 ENCOUNTER — Other Ambulatory Visit: Payer: Self-pay

## 2018-10-15 ENCOUNTER — Ambulatory Visit (AMBULATORY_SURGERY_CENTER): Payer: Self-pay

## 2018-10-15 VITALS — Ht 64.0 in | Wt 169.2 lb

## 2018-10-15 DIAGNOSIS — Z85038 Personal history of other malignant neoplasm of large intestine: Secondary | ICD-10-CM

## 2018-10-15 NOTE — Progress Notes (Signed)
No egg or soy allergy known to patient  No issues with past sedation with any surgeries  or procedures, no intubation problems  No diet pills per patient No home 02 use per patient  No blood thinners per patient  Pt denies issues with constipation  No A fib or A flutter  EMMI video sent to pt's e mail , pt declined    

## 2018-10-22 ENCOUNTER — Other Ambulatory Visit: Payer: Medicare HMO

## 2018-10-22 DIAGNOSIS — Z85038 Personal history of other malignant neoplasm of large intestine: Secondary | ICD-10-CM

## 2018-10-23 LAB — CEA: CEA: 3.9 ng/mL — AB

## 2018-10-28 ENCOUNTER — Telehealth: Payer: Self-pay | Admitting: Gastroenterology

## 2018-10-28 NOTE — Telephone Encounter (Signed)
Pt called stating returning your call she is wanting a call back on the cell phone which is [phone] and a detailed VM can be left since she is at work

## 2018-10-28 NOTE — Telephone Encounter (Signed)
The CEA level was slightly lower than it was 6 months ago. She is having a flex sig with me later this week and should continue with that appointment. I will discuss more the time of the appointment.  The pt was advised and will keep appt as scheduled.

## 2018-10-30 ENCOUNTER — Encounter: Payer: Self-pay | Admitting: Gastroenterology

## 2018-10-30 ENCOUNTER — Ambulatory Visit (AMBULATORY_SURGERY_CENTER): Payer: Medicare HMO | Admitting: Gastroenterology

## 2018-10-30 VITALS — BP 137/66 | HR 68 | Temp 99.6°F | Resp 40 | Ht 64.0 in | Wt 169.0 lb

## 2018-10-30 DIAGNOSIS — Z85038 Personal history of other malignant neoplasm of large intestine: Secondary | ICD-10-CM

## 2018-10-30 DIAGNOSIS — D125 Benign neoplasm of sigmoid colon: Secondary | ICD-10-CM

## 2018-10-30 DIAGNOSIS — D128 Benign neoplasm of rectum: Secondary | ICD-10-CM

## 2018-10-30 DIAGNOSIS — D127 Benign neoplasm of rectosigmoid junction: Secondary | ICD-10-CM

## 2018-10-30 DIAGNOSIS — D129 Benign neoplasm of anus and anal canal: Secondary | ICD-10-CM

## 2018-10-30 MED ORDER — SODIUM CHLORIDE 0.9 % IV SOLN: 500.0000 mL | Freq: Once | INTRAVENOUS | Status: DC

## 2018-10-30 NOTE — Progress Notes (Signed)
Pt's states no medical or surgical changes since previsit or office visit. 

## 2018-10-30 NOTE — Progress Notes (Signed)
Report given to PACU, vss 

## 2018-10-30 NOTE — Progress Notes (Signed)
Called to room to assist during endoscopic procedure.  Patient ID and intended procedure confirmed with present staff. Received instructions for my participation in the procedure from the performing physician.  

## 2018-10-30 NOTE — Op Note (Addendum)
[place] Patient Name: Stephanie Leblanc Procedure Date: 10/30/2018 8:35 AM MRN: [id number] Endoscopist: Milus Banister , MD Age: 68 Referring MD:  Date of Birth: May 17, 1951 Gender: Female Account #: [id number] Procedure:                Flexible Sigmoidoscopy Indications:              High risk colon cancer surveillance: Personal                            history of colon cancer; very early stage colon                            cancer in a sigmoid polyp removed with                            snare/cautery 01/2018 ago; favorable pathology                            findings, CT scan without obvious metastatic                            disease, CEA 4.5; case was discussed at 03/2018 GI                            cancer conference. Flex sig 03/2018 site of sigmoid                            polypectomy was not evident on examination. CEA                            10/2018 3.9 Medicines:                Monitored Anesthesia Care Procedure:                Pre-Anesthesia Assessment:                           - Prior to the procedure, a History and Physical                            was performed, and patient medications and                            allergies were reviewed. The patient's tolerance of                            previous anesthesia was also reviewed. The risks                            and benefits of the procedure and the sedation                            options and risks were discussed with the patient.  All questions were answered, and informed consent                            was obtained. Prior Anticoagulants: The patient has                            taken no previous anticoagulant or antiplatelet                            agents. ASA Grade Assessment: II - A patient with                            mild systemic disease. After reviewing the risks                            and benefits, the patient was deemed in                 satisfactory condition to undergo the procedure.                           After obtaining informed consent, the scope was                            passed under direct vision. The Colonoscope was                            introduced through the anus and advanced to the the                            splenic flexure. The flexible sigmoidoscopy was                            accomplished without difficulty. The patient                            tolerated the procedure well. The quality of the                            bowel preparation was excellent. Scope In: Scope Out: Findings:                 Two sessile polyps were found in the rectum and                            sigmoid colon. The polyps were 1 to 2 mm in size.                            These polyps were removed with a cold snare.                            Resection and retrieval were complete.                           The exam was otherwise without  abnormality.                           The site of 01/2018 snare/cautery polypectomy was                            again not evident on this examination. Complications:            No immediate complications. Estimated blood loss:                            None. Estimated Blood Loss:     Estimated blood loss: none. Impression:               - Two 1 to 2 mm polyps in the rectum and in the                            sigmoid colon, removed with a cold snare. Resected                            and retrieved.                           - The examination was otherwise normal. Recommendation:           - Patient has a contact number available for                            emergencies. The signs and symptoms of potential                            delayed complications were discussed with the                            patient. Return to normal activities tomorrow.                            Written discharge instructions were provided to the                             patient.                           - Resume regular diet.                           - Await final pathology results from the polyps                            removed today. LIkely will repeat CEA in 6 months                            and also recall full colonoscopy in 6 months. Milus Banister, MD 10/30/2018 8:54:38 AM This report has been signed electronically.

## 2018-10-30 NOTE — Patient Instructions (Signed)
Information on polyps given to you today  Await pathology on polyps removed   Repeat Colonoscopy in 6 mos and likely a CEA level in 6 mos    YOU HAD AN ENDOSCOPIC PROCEDURE TODAY AT [facility]:   Refer to the procedure report that was given to you for any specific questions about what was found during the examination.  If the procedure report does not answer your questions, please call your gastroenterologist to clarify.  If you requested that your care partner not be given the details of your procedure findings, then the procedure report has been included in a sealed envelope for you to review at your convenience later.  YOU SHOULD EXPECT: Some feelings of bloating in the abdomen. Passage of more gas than usual.  Walking can help get rid of the air that was put into your GI tract during the procedure and reduce the bloating. If you had a lower endoscopy (such as a colonoscopy or flexible sigmoidoscopy) you may notice spotting of blood in your stool or on the toilet paper. If you underwent a bowel prep for your procedure, you may not have a normal bowel movement for a few days.  Please Note:  You might notice some irritation and congestion in your nose or some drainage.  This is from the oxygen used during your procedure.  There is no need for concern and it should clear up in a day or so.  SYMPTOMS TO REPORT IMMEDIATELY:   Following lower endoscopy (colonoscopy or flexible sigmoidoscopy):  Excessive amounts of blood in the stool  Significant tenderness or worsening of abdominal pains  Swelling of the abdomen that is new, acute  Fever of 100F or higher    For urgent or emergent issues, a gastroenterologist can be reached at any hour by calling [phone].   DIET:  We do recommend a small meal at first, but then you may proceed to your regular diet.  Drink plenty of fluids but you should avoid alcoholic beverages for 24 hours.  ACTIVITY:  You should plan to take it  easy for the rest of today and you should NOT DRIVE or use heavy machinery until tomorrow (because of the sedation medicines used during the test).    FOLLOW UP: Our staff will call the number listed on your records the next business day following your procedure to check on you and address any questions or concerns that you may have regarding the information given to you following your procedure. If we do not reach you, we will leave a message.  However, if you are feeling well and you are not experiencing any problems, there is no need to return our call.  We will assume that you have returned to your regular daily activities without incident.  If any biopsies were taken you will be contacted by phone or by letter within the next 1-3 weeks.  Please call us at [phone] if you have not heard about the biopsies in 3 weeks.    SIGNATURES/CONFIDENTIALITY: You and/or your care partner have signed paperwork which will be entered into your electronic medical record.  These signatures attest to the fact that that the information above on your After Visit Summary has been reviewed and is understood.  Full responsibility of the confidentiality of this discharge information lies with you and/or your care-partner.

## 2018-11-02 ENCOUNTER — Telehealth: Payer: Self-pay | Admitting: *Deleted

## 2018-11-02 NOTE — Telephone Encounter (Signed)
  Follow up Call-  Call back number 10/30/2018 02/24/2018  Post procedure Call Back phone  # [phone]  Permission to leave phone message Yes Yes  Some recent data might be hidden     Patient questions:  Do you have a fever, pain , or abdominal swelling? No. Pain Score  0 *  Have you tolerated food without any problems? Yes.    Have you been able to return to your normal activities? Yes.    Do you have any questions about your discharge instructions: Diet   No. Medications  No. Follow up visit  No.  Do you have questions or concerns about your Care? No.  Actions: * If pain score is 4 or above: No action needed, pain <4.

## 2018-11-05 ENCOUNTER — Other Ambulatory Visit: Payer: Self-pay

## 2018-11-05 ENCOUNTER — Encounter: Payer: Self-pay | Admitting: Gastroenterology

## 2018-11-05 DIAGNOSIS — Z85038 Personal history of other malignant neoplasm of large intestine: Secondary | ICD-10-CM

## 2019-03-24 ENCOUNTER — Telehealth: Payer: Self-pay | Admitting: *Deleted

## 2019-03-24 NOTE — Telephone Encounter (Signed)

## 2019-03-25 ENCOUNTER — Other Ambulatory Visit: Payer: Self-pay

## 2019-03-25 ENCOUNTER — Ambulatory Visit (INDEPENDENT_AMBULATORY_CARE_PROVIDER_SITE_OTHER)
Admission: RE | Admit: 2019-03-25 | Discharge: 2019-03-25 | Disposition: A | Discharge status: Home / Self Care | Payer: Medicare HMO | Source: Ambulatory Visit | Attending: Emergency Medicine | Admitting: Emergency Medicine

## 2019-03-25 DIAGNOSIS — R918 Other nonspecific abnormal finding of lung field: Secondary | ICD-10-CM | POA: Diagnosis not present

## 2019-03-29 ENCOUNTER — Encounter: Payer: Self-pay | Admitting: Gastroenterology

## 2019-04-05 ENCOUNTER — Encounter: Payer: Self-pay | Admitting: Gastroenterology

## 2019-04-19 ENCOUNTER — Encounter: Payer: Self-pay | Admitting: Emergency Medicine

## 2019-04-19 ENCOUNTER — Other Ambulatory Visit: Payer: Self-pay

## 2019-04-19 ENCOUNTER — Ambulatory Visit: Payer: Medicare HMO | Admitting: Emergency Medicine

## 2019-04-19 DIAGNOSIS — R0602 Shortness of breath: Secondary | ICD-10-CM

## 2019-04-19 DIAGNOSIS — R9389 Abnormal findings on diagnostic imaging of other specified body structures: Secondary | ICD-10-CM | POA: Diagnosis not present

## 2019-04-19 DIAGNOSIS — R0600 Dyspnea, unspecified: Secondary | ICD-10-CM | POA: Insufficient documentation

## 2019-04-19 HISTORY — DX: Dyspnea, unspecified: R06.00

## 2019-04-19 NOTE — Assessment & Plan Note (Signed)
Her CT scan of the chest shows stable small pulmonary nodular disease after 1 year.  In absence of any chemotherapy (which she did not require) these are certainly benign, unrelated to her colon cancer.  She is a never smoker with no family history of lung cancer.  I do not think these need to be followed any further.  Certainly if she has a clinical change, concern for progression of her colonic malignancy then we would reimage.

## 2019-04-19 NOTE — Assessment & Plan Note (Signed)
She has had some slight increase in shortness of breath with exertion, especially stairs or chores in the house.  She notes that she is gained some weight because activity has been restricted the isolation practices.  Denies any cough, wheeze.  If the trend continues, breathing progressively worsens then I would have her back, consider pulmonary function testing and further work-up.

## 2019-04-19 NOTE — Progress Notes (Signed)
Subjective:    Patient ID: Stephanie Leblanc, female    DOB: Sep 22, 1950, 68 y.o.   MRN: [id number]  HPI 68 year old never smoker with a history of right breast cancer that was treated w surgery + chemoradiation 2003, remote melanoma that was surgically 1997, GERD, hypertension, hyperlipidemia.  She has a new diagnosis of colon cancer that was made on a polyp, by Dr. Ardis Hughs by colonoscopy. CT abdomen / chest did not show a mass or any LAD.  There was an isolated 3 mm pulmonary nodule noted in the right upper lobe.  Also some very small areas of pleural fluid/thickening bilaterally.  She denies any cough, wheeze, chest pain.  She does occasionally get exertional shortness of breath when she climbs stairs.  ROV 04/19/2019 -- 68 year old woman with a history of breast cancer, melanoma (both remote) and more recently discovered and treated colon cancer.  I saw her a year ago for a 3 mm right upper lobe pulmonary nodule and some associated pleural thickening.  Based on her history of malignancy we decided to repeat her CT scan after 12 months.  CT chest on 03/25/2019 was reviewed by me, shows stable 3 mm right upper lobe and 2 mm left upper lobe nodules, stable left upper lobe calcified granuloma.  No effusions or new infiltrates present. She is slightly more SOB w exertion. No wheeze or cough.    Review of Systems  Constitutional: Negative for fever and unexpected weight change.  HENT: Positive for dental problem. Negative for congestion, ear pain, nosebleeds, postnasal drip, rhinorrhea, sinus pressure, sneezing, sore throat and trouble swallowing.   Eyes: Negative for redness and itching.  Respiratory: Positive for shortness of breath. Negative for cough, chest tightness and wheezing.   Cardiovascular: Negative for palpitations and leg swelling.  Gastrointestinal: Negative for nausea and vomiting.  Genitourinary: Negative for dysuria.  Musculoskeletal: Negative for joint swelling.  Skin: Negative  for rash.  Neurological: Negative for headaches.  Hematological: Does not bruise/bleed easily.  Psychiatric/Behavioral: Negative for dysphoric mood. The patient is not nervous/anxious.     Past Medical History:  Diagnosis Date  . Anxiety   . Arthritis    knee left  . Breast cancer ([place]) 2003  . Cataract   . Elevated cholesterol 2019  . GERD (gastroesophageal reflux disease)   . Hypertension 2003  . Kidney stones    2 stones in the past     Family History  Problem Relation Age of Onset  . Heart disease Mother   . Diabetes Mother   . Heart disease Father   . Colon cancer Neg Hx        No colon cancer in family.  . Esophageal cancer Neg Hx   . Rectal cancer Neg Hx   . Stomach cancer Neg Hx      Social History   Socioeconomic History  . Marital status: Married    Spouse name: Not on file  . Number of children: Not on file  . Years of education: Not on file  . Highest education level: Not on file  Occupational History  . Not on file  Social Needs  . Financial resource strain: Not on file  . Food insecurity    Worry: Not on file    Inability: Not on file  . Transportation needs    Medical: Not on file    Non-medical: Not on file  Tobacco Use  . Smoking status: Never Smoker  . Smokeless tobacco: Never Used  Substance and  Sexual Activity  . Alcohol use: Never    Frequency: Never  . Drug use: Never  . Sexual activity: Not on file  Lifestyle  . Physical activity    Days per week: Not on file    Minutes per session: Not on file  . Stress: Not on file  Relationships  . Social Herbalist on phone: Not on file    Gets together: Not on file    Attends religious service: Not on file    Active member of club or organization: Not on file    Attends meetings of clubs or organizations: Not on file    Relationship status: Not on file  . Intimate partner violence    Fear of current or ex partner: Not on file    Emotionally abused: Not on file     Physically abused: Not on file    Forced sexual activity: Not on file  Other Topics Concern  . Not on file  Social History Narrative  . Not on file   She is from [place], no real exposures.  Office worker Owns a [employer]  Allergies  Allergen Reactions  . Compazine [Prochlorperazine Edisylate]     Muscle contractions     Outpatient Medications Prior to Visit  Medication Sig Dispense Refill  . acetaminophen (TYLENOL) 500 MG tablet Take 1,000 mg by mouth every 8 (eight) hours as needed for moderate pain.    Marland Kitchen aspirin 81 MG chewable tablet Chew 81 mg by mouth daily.    . Calcium Carbonate-Vit D-Min (CALCIUM 1200 PO) Take 1,200 mg by mouth daily.     . cholecalciferol (VITAMIN D) 1000 units tablet Take 1,000 Units by mouth daily.    Marland Kitchen ibuprofen (ADVIL,MOTRIN) 800 MG tablet Take one tablet every 8 hours as needed for pain.    . pantoprazole (PROTONIX) 40 MG tablet Take 40 mg by mouth daily.    . sertraline (ZOLOFT) 50 MG tablet Take 50 mg by mouth daily.    Marland Kitchen spironolactone (ALDACTONE) 25 MG tablet Take 25 mg by mouth daily.     . valsartan (DIOVAN) 320 MG tablet Take 320 mg by mouth daily.      No facility-administered medications prior to visit.         Objective:   Physical Exam Vitals:   04/19/19 0916  BP: 138/60  Pulse: (!) 110  Temp: 98.3 F (36.8 C)  TempSrc: Oral  SpO2: 93%  Weight: 174 lb 3.2 oz (79 kg)  Height: 5\' 4"  (1.626 m)   Gen: Pleasant, well-nourished, in no distress,  normal affect  ENT: No lesions,  mouth clear,  oropharynx clear, no postnasal drip  Neck: No JVD, no stridor  Lungs: No use of accessory muscles, no wheeze or crackles  Cardiovascular: RRR, heart sounds normal, no murmur or gallops, no peripheral edema  Musculoskeletal: No deformities, no cyanosis or clubbing  Neuro: alert, non focal  Skin: Warm, no lesions or rash      Assessment & Plan:  Abnormal CT of the chest Her CT scan of the chest shows stable small pulmonary nodular  disease after 1 year.  In absence of any chemotherapy (which she did not require) these are certainly benign, unrelated to her colon cancer.  She is a never smoker with no family history of lung cancer.  I do not think these need to be followed any further.  Certainly if she has a clinical change, concern for progression of her colonic malignancy then  we would reimage.  Dyspnea She has had some slight increase in shortness of breath with exertion, especially stairs or chores in the house.  She notes that she is gained some weight because activity has been restricted the isolation practices.  Denies any cough, wheeze.  If the trend continues, breathing progressively worsens then I would have her back, consider pulmonary function testing and further work-up.   Baltazar Apo, MD, PhD 04/19/2019, 9:36 AM [facility] [phone] or if no answer [phone]

## 2019-04-19 NOTE — Patient Instructions (Signed)
Your CT scan of the chest does not show any significant change in your small pulmonary nodules compared with 1 year ago.  This is great news.  He should not need to have any follow-up scans unless there is a change in how you are feeling or unless recommended by Dr. Ardis Hughs based on management of your colon cancer. Follow with Dr Lamonte Sakai if needed for any changes in your breathing or if we need to further evaluate your chest imaging.

## 2019-04-23 ENCOUNTER — Other Ambulatory Visit: Payer: Self-pay

## 2019-04-23 ENCOUNTER — Ambulatory Visit (AMBULATORY_SURGERY_CENTER): Payer: Self-pay | Admitting: *Deleted

## 2019-04-23 VITALS — Temp 97.7°F | Ht 64.0 in | Wt 175.0 lb

## 2019-04-23 DIAGNOSIS — Z85038 Personal history of other malignant neoplasm of large intestine: Secondary | ICD-10-CM

## 2019-04-23 MED ORDER — PEG 3350-KCL-NA BICARB-NACL 420 G PO SOLR: 4000.0000 mL | Freq: Once | ORAL | 0 refills | Status: AC

## 2019-04-23 NOTE — Progress Notes (Signed)
No egg or soy allergy known to patient  No issues with past sedation with any surgeries  or procedures, no intubation problems  No diet pills per patient No home 02 use per patient  No blood thinners per patient  Pt denies issues with constipation  No A fib or A flutter  EMMI video sent to pt's e mail pt declined   

## 2019-04-30 ENCOUNTER — Encounter: Payer: Self-pay | Admitting: Gastroenterology

## 2019-05-06 ENCOUNTER — Telehealth: Payer: Self-pay

## 2019-05-06 NOTE — Telephone Encounter (Signed)
Covid-19 screening questions   Do you now or have you had a fever in the last 14 days?  Do you have any respiratory symptoms of shortness of breath or cough now or in the last 14 days?  Do you have any family members or close contacts with diagnosed or suspected Covid-19 in the past 14 days?  Have you been tested for Covid-19 and found to be positive?       

## 2019-05-07 ENCOUNTER — Ambulatory Visit (AMBULATORY_SURGERY_CENTER): Payer: Medicare HMO | Admitting: Gastroenterology

## 2019-05-07 ENCOUNTER — Other Ambulatory Visit: Payer: Self-pay

## 2019-05-07 ENCOUNTER — Encounter: Payer: Self-pay | Admitting: Gastroenterology

## 2019-05-07 ENCOUNTER — Other Ambulatory Visit: Payer: Medicare HMO

## 2019-05-07 VITALS — BP 135/72 | HR 67 | Temp 98.5°F | Resp 13 | Ht 64.0 in | Wt 175.0 lb

## 2019-05-07 DIAGNOSIS — Z85038 Personal history of other malignant neoplasm of large intestine: Secondary | ICD-10-CM

## 2019-05-07 DIAGNOSIS — Z08 Encounter for follow-up examination after completed treatment for malignant neoplasm: Secondary | ICD-10-CM | POA: Diagnosis not present

## 2019-05-07 HISTORY — PX: COLONOSCOPY: SHX174

## 2019-05-07 LAB — CEA: CEA: 4.5 ng/mL — ABNORMAL HIGH

## 2019-05-07 MED ORDER — SODIUM CHLORIDE 0.9 % IV SOLN: 500.0000 mL | Freq: Once | INTRAVENOUS | Status: DC

## 2019-05-07 NOTE — Progress Notes (Signed)
Report to PACU, RN, vss, BBS= Clear.  

## 2019-05-07 NOTE — Progress Notes (Signed)
Pt's states no medical or surgical changes since previsit or office visit. 

## 2019-05-07 NOTE — Op Note (Signed)
[facility] Patient Name: Stephanie Leblanc Procedure Date: 05/07/2019 9:55 AM MRN: [id number] Endoscopist: Milus Banister , MD Age: 68 Referring MD:  Date of Birth: 10/30/1950 Gender: Female Account #: [id number] Procedure:                Colonoscopy Indications:              High risk colon cancer surveillance: Personal                            history of colon cancer; Personal history of colon                            cancer; very early stage colon cancer in a sigmoid                            polyp removed with snare/cautery 01/2018 ago;                            favorable pathology findings, CT scan without                            obvious metastatic disease, CEA 4.5; case was                            discussed at 03/2018 GI cancer conference. Flex sig                            03/2018 site of sigmoid polypectomy was not evident                            on examination. CEA 10/2018 3.9. Flex sig 10/2018 two                            diminutive TAs removed from recto sigmoid,                            otherwise normal examination. Medicines:                Monitored Anesthesia Care Procedure:                Pre-Anesthesia Assessment:                           - Prior to the procedure, a History and Physical                            was performed, and patient medications and                            allergies were reviewed. The patient's tolerance of                            previous anesthesia was also reviewed. The risks  and benefits of the procedure and the sedation                            options and risks were discussed with the patient.                            All questions were answered, and informed consent                            was obtained. Prior Anticoagulants: The patient has                            taken no previous anticoagulant or antiplatelet                            agents. ASA Grade Assessment: II - A  patient with                            mild systemic disease. After reviewing the risks                            and benefits, the patient was deemed in                            satisfactory condition to undergo the procedure.                           After obtaining informed consent, the colonoscope                            was passed under direct vision. Throughout the                            procedure, the patient's blood pressure, pulse, and                            oxygen saturations were monitored continuously. The                            Colonoscope was introduced through the anus and                            advanced to the the cecum, identified by                            appendiceal orifice and ileocecal valve. The                            colonoscopy was performed without difficulty. The                            patient tolerated the procedure well. The quality  of the bowel preparation was good. The ileocecal                            valve, appendiceal orifice, and rectum were                            photographed. Scope In: 9:58:55 AM Scope Out: 10:10:19 AM Scope Withdrawal Time: 0 hours 8 minutes 21 seconds  Total Procedure Duration: 0 hours 11 minutes 24 seconds  Findings:                 The entire examined colon appeared normal on direct                            and retroflexion views. Complications:            No immediate complications. Estimated blood loss:                            None. Estimated Blood Loss:     Estimated blood loss: none. Impression:               - The entire examined colon is normal on direct and                            retroflexion views.                           - No polyps or cancers. Recommendation:           - Patient has a contact number available for                            emergencies. The signs and symptoms of potential                            delayed complications were  discussed with the                            patient. Return to normal activities tomorrow.                            Written discharge instructions were provided to the                            patient.                           - Resume previous diet.                           - Continue present medications.                           - Repeat colonoscopy in 3 years for surveillance.                           - Dr. Ardis Hughs' office  to arrange CEA level to be                            checked. Milus Banister, MD 05/07/2019 10:13:43 AM This report has been signed electronically.

## 2019-05-07 NOTE — Patient Instructions (Signed)
Thank you for allowing [place] to care for you today!  No pathology was taken.  Recommend next surveillance colonoscopy in 3 years.  Dr Ardis Hughs' office will arrange a CEA level to be checked.    Resume previous diet and medications today.  Return to your normal activities tomorrow.   YOU HAD AN ENDOSCOPIC PROCEDURE TODAY AT [facility]:   Refer to the procedure report that was given to you for any specific questions about what was found during the examination.  If the procedure report does not answer your questions, please call your gastroenterologist to clarify.  If you requested that your care partner not be given the details of your procedure findings, then the procedure report has been included in a sealed envelope for you to review at your convenience later.  YOU SHOULD EXPECT: Some feelings of bloating in the abdomen. Passage of more gas than usual.  Walking can help get rid of the air that was put into your GI tract during the procedure and reduce the bloating. If you had a lower endoscopy (such as a colonoscopy or flexible sigmoidoscopy) you may notice spotting of blood in your stool or on the toilet paper. If you underwent a bowel prep for your procedure, you may not have a normal bowel movement for a few days.  Please Note:  You might notice some irritation and congestion in your nose or some drainage.  This is from the oxygen used during your procedure.  There is no need for concern and it should clear up in a day or so.  SYMPTOMS TO REPORT IMMEDIATELY:   Following lower endoscopy (colonoscopy or flexible sigmoidoscopy):  Excessive amounts of blood in the stool  Significant tenderness or worsening of abdominal pains  Swelling of the abdomen that is new, acute  Fever of 100F or higher    For urgent or emergent issues, a gastroenterologist can be reached at any hour by calling [phone].   DIET:  We do recommend a small meal at first, but then you may proceed to  your regular diet.  Drink plenty of fluids but you should avoid alcoholic beverages for 24 hours.  ACTIVITY:  You should plan to take it easy for the rest of today and you should NOT DRIVE or use heavy machinery until tomorrow (because of the sedation medicines used during the test).    FOLLOW UP: Our staff will call the number listed on your records 48-72 hours following your procedure to check on you and address any questions or concerns that you may have regarding the information given to you following your procedure. If we do not reach you, we will leave a message.  We will attempt to reach you two times.  During this call, we will ask if you have developed any symptoms of COVID 19. If you develop any symptoms (ie: fever, flu-like symptoms, shortness of breath, cough etc.) before then, please call [phone].  If you test positive for Covid 19 in the 2 weeks post procedure, please call and report this information to [place].    If any biopsies were taken you will be contacted by phone or by letter within the next 1-3 weeks.  Please call us at [phone] if you have not heard about the biopsies in 3 weeks.    SIGNATURES/CONFIDENTIALITY: You and/or your care partner have signed paperwork which will be entered into your electronic medical record.  These signatures attest to the fact that that the information above  on your After Visit Summary has been reviewed and is understood.  Full responsibility of the confidentiality of this discharge information lies with you and/or your care-partner.

## 2019-05-07 NOTE — Progress Notes (Signed)
Temperature taken by J.B., VS taken by C.W. 

## 2019-05-12 ENCOUNTER — Telehealth: Payer: Self-pay | Admitting: *Deleted

## 2019-05-12 NOTE — Telephone Encounter (Signed)
  Follow up Call-  Call back number 05/07/2019 10/30/2018 02/24/2018  Post procedure Call Back phone  # [phone]  Permission to leave phone message Yes Yes Yes  Some recent data might be hidden    Spoke with husband Patient questions:  Do you have a fever, pain , or abdominal swelling? No. Pain Score  0 *  Have you tolerated food without any problems? Yes.    Have you been able to return to your normal activities? Yes.    Do you have any questions about your discharge instructions: Diet   No. Medications  No. Follow up visit  No.  Do you have questions or concerns about your Care? No.  Actions: * If pain score is 4 or above: No action needed, pain <4.  1. Have you developed a fever since your procedure? no  2.   Have you had an respiratory symptoms (SOB or cough) since your procedure? no  3.   Have you tested positive for COVID 19 since your procedure no  4.   Have you had any family members/close contacts diagnosed with the COVID 19 since your procedure?  no   If yes to any of these questions please route to Joylene John, RN and Alphonsa Gin, Therapist, sports.

## 2019-05-17 ENCOUNTER — Other Ambulatory Visit: Payer: Self-pay

## 2019-05-17 DIAGNOSIS — Z85038 Personal history of other malignant neoplasm of large intestine: Secondary | ICD-10-CM

## 2019-09-09 DIAGNOSIS — Z853 Personal history of malignant neoplasm of breast: Secondary | ICD-10-CM

## 2019-09-09 DIAGNOSIS — Z85038 Personal history of other malignant neoplasm of large intestine: Secondary | ICD-10-CM

## 2019-09-25 ENCOUNTER — Ambulatory Visit: Payer: Medicare HMO | Attending: Internal Medicine

## 2019-09-25 DIAGNOSIS — Z23 Encounter for immunization: Secondary | ICD-10-CM | POA: Insufficient documentation

## 2019-09-25 NOTE — Progress Notes (Signed)
   Covid-19 Vaccination Clinic  Name:  Stephanie Leblanc    MRN: [id number] DOB: May 29, 1951  09/25/2019  [name] was observed post Covid-19 immunization for 15 minutes without incidence. She was provided with Vaccine Information Sheet and instruction to access the V-Safe system.   [name] was instructed to call 911 with any severe reactions post vaccine: Marland Kitchen Difficulty breathing  . Swelling of your face and throat  . A fast heartbeat  . A bad rash all over your body  . Dizziness and weakness    Immunizations Administered    Name Date Dose VIS Date Route   Pfizer COVID-19 Vaccine 09/25/2019  2:26 PM 0.3 mL 08/13/2019 Intramuscular   Manufacturer: [place]   Lot: [id number]   [place]: [id number]

## 2019-10-16 ENCOUNTER — Ambulatory Visit: Payer: Medicare HMO | Attending: Internal Medicine

## 2019-10-16 DIAGNOSIS — Z23 Encounter for immunization: Secondary | ICD-10-CM | POA: Insufficient documentation

## 2019-10-16 NOTE — Progress Notes (Signed)
   Covid-19 Vaccination Clinic  Name:  Stephanie Leblanc    MRN: [id number] DOB: 1950-11-22  10/16/2019  [name] was observed post Covid-19 immunization for 15 minutes without incidence. She was provided with Vaccine Information Sheet and instruction to access the V-Safe system.   Ms. Band was instructed to call 911 with any severe reactions post vaccine: Marland Kitchen Difficulty breathing  . Swelling of your face and throat  . A fast heartbeat  . A bad rash all over your body  . Dizziness and weakness    Immunizations Administered    Name Date Dose VIS Date Route   Pfizer COVID-19 Vaccine 10/16/2019 12:45 PM 0.3 mL 08/13/2019 Intramuscular   Manufacturer: [place]   Lot: [id number]   [place]: [id number]

## 2019-11-25 ENCOUNTER — Other Ambulatory Visit: Payer: Medicare HMO

## 2019-11-25 DIAGNOSIS — Z85038 Personal history of other malignant neoplasm of large intestine: Secondary | ICD-10-CM

## 2019-11-26 LAB — CEA: CEA: 4.9 ng/mL — ABNORMAL HIGH

## 2019-11-29 ENCOUNTER — Telehealth: Payer: Self-pay | Admitting: Physician Assistant

## 2019-11-29 NOTE — Telephone Encounter (Signed)
The pt has been advised that Dr Ardis Hughs has not reviewed the labs and she will be called as soon as reviewed.  The pt has been advised of the information and verbalized understanding.

## 2019-11-29 NOTE — Telephone Encounter (Signed)
Patient called in reference to CEA results wanted to know if she should come in to be seen.

## 2019-11-30 ENCOUNTER — Other Ambulatory Visit: Payer: Self-pay

## 2019-11-30 DIAGNOSIS — R970 Elevated carcinoembryonic antigen [CEA]: Secondary | ICD-10-CM

## 2019-12-06 ENCOUNTER — Telehealth: Payer: Self-pay

## 2019-12-06 DIAGNOSIS — Z85038 Personal history of other malignant neoplasm of large intestine: Secondary | ICD-10-CM

## 2019-12-06 NOTE — Telephone Encounter (Signed)
-----   Message from Milus Banister, MD sent at 12/06/2019  7:19 AM EDT ----- Krista Blue, The rising CEA is the only reason I was sending her.  I will do what you suggested above, CT scan abdomen pelvis, EGD.  She is not a smoker.  Mccade Sullenberger, Can you let her know that I have communicated with oncology and they do not think she necessarily needs to see them in person.  They recommended CT scan abdomen and pelvis as well as upper endoscopy.  Please arrange those.  Indication is personal history of colon cancer, early stage.  Thanks  ----- Message ----- From: Truitt Merle, MD Sent: 12/02/2019   8:06 AM EDT To: Jonnie Finner, RN, Milus Banister, MD, #  Jacqulyn Cane,  We got your referral, just want to make sure the referral was for raising CEA, or do you have any other concerns? I saw he had colonoscopy in 2020, EGD 2019, and CT CAP scan in 2019 and CT chest in July 2020. If you want to rule out malignancy, you may repeat CT abd/pel, and or EGD. If she is a smoker, maybe repeat CT chest also. Her CEA level is relatively low, it was 4.5 in 2019, now 4.9, likely non-specific, my suspicion for malignancy is low. Not sure if she needs to see [place]. I will certainly be happy to see her if you still want me to.  Thanks  Krista Blue

## 2019-12-07 ENCOUNTER — Other Ambulatory Visit: Payer: Self-pay

## 2019-12-07 DIAGNOSIS — Z1159 Encounter for screening for other viral diseases: Secondary | ICD-10-CM

## 2019-12-07 NOTE — Telephone Encounter (Signed)
The pt has been advised of the CT scan and EGD, pre visit and COVID appt scheduled.

## 2019-12-07 NOTE — Telephone Encounter (Signed)
Left message on machine to call back   You are scheduled on 12/15/19 at 830 am at [facility]. You should arrive 15 minutes prior to your appointment time for registration. Please follow the written instructions below on the day of your exam:  WARNING: IF YOU ARE ALLERGIC TO IODINE/X-RAY DYE, PLEASE NOTIFY RADIOLOGY IMMEDIATELY AT [phone]! YOU WILL BE GIVEN A 13 HOUR PREMEDICATION PREP.  1) Do not eat or drink anything after 430 am (4 hours prior to your test)  You will need to pick up contrast at [facility] radiology at least 2 days prior to the appointment.  Please come to our office the same day you pick up the contrast and head to our basement lab for lab work.  This will need to be completed prior to the CT scan.    The pt has also been advised to call our office to schedule EGD.

## 2019-12-13 ENCOUNTER — Other Ambulatory Visit (INDEPENDENT_AMBULATORY_CARE_PROVIDER_SITE_OTHER): Payer: Medicare HMO

## 2019-12-13 DIAGNOSIS — Z85038 Personal history of other malignant neoplasm of large intestine: Secondary | ICD-10-CM

## 2019-12-13 DIAGNOSIS — M8580 Other specified disorders of bone density and structure, unspecified site: Secondary | ICD-10-CM | POA: Insufficient documentation

## 2019-12-13 HISTORY — DX: Other specified disorders of bone density and structure, unspecified site: M85.80

## 2019-12-13 LAB — BUN: BUN: 17 mg/dL (ref 6–23)

## 2019-12-13 LAB — CREATININE, SERUM: Creatinine, Ser: 0.68 mg/dL (ref 0.40–1.20)

## 2019-12-15 ENCOUNTER — Ambulatory Visit (HOSPITAL_COMMUNITY): Admission: RE | Admit: 2019-12-15 | Payer: Medicare HMO | Source: Ambulatory Visit

## 2019-12-17 ENCOUNTER — Ambulatory Visit (AMBULATORY_SURGERY_CENTER): Payer: Self-pay | Admitting: *Deleted

## 2019-12-17 ENCOUNTER — Other Ambulatory Visit: Payer: Self-pay

## 2019-12-17 VITALS — Temp 96.2°F | Ht 64.0 in | Wt 175.0 lb

## 2019-12-17 DIAGNOSIS — R970 Elevated carcinoembryonic antigen [CEA]: Secondary | ICD-10-CM

## 2019-12-17 NOTE — Progress Notes (Signed)
Patient is here in-person for PV. Patient denies any allergies to eggs or soy. Patient denies any problems with anesthesia/sedation. Patient denies any oxygen use at home. Patient denies taking any diet/weight loss medications or blood thinners. Patient is not being treated for MRSA or C-diff. EMMI education assisgned to the patient for the procedure, this was explained and instructions given to patient.   Patient is aware of our care-partner policy and 0000000 safety protocol.   Pt has completed both vaccines.

## 2019-12-20 ENCOUNTER — Ambulatory Visit (HOSPITAL_COMMUNITY)
Admission: RE | Admit: 2019-12-20 | Discharge: 2019-12-20 | Disposition: A | Discharge status: Home / Self Care | Payer: Medicare HMO | Source: Ambulatory Visit | Attending: Gastroenterology | Admitting: Gastroenterology

## 2019-12-20 ENCOUNTER — Encounter (HOSPITAL_COMMUNITY): Payer: Self-pay

## 2019-12-20 ENCOUNTER — Other Ambulatory Visit: Payer: Self-pay

## 2019-12-20 DIAGNOSIS — Z85038 Personal history of other malignant neoplasm of large intestine: Secondary | ICD-10-CM | POA: Diagnosis not present

## 2019-12-20 MED ORDER — IOHEXOL 300 MG/ML  SOLN
100.0000 mL | Freq: Once | INTRAMUSCULAR | Status: AC | PRN
  Administered 2019-12-20: 08:00:00 100 mL via INTRAVENOUS

## 2019-12-20 MED ORDER — SODIUM CHLORIDE (PF) 0.9 % IJ SOLN
INTRAMUSCULAR | Status: AC
  Filled 2019-12-20: qty 50

## 2019-12-21 ENCOUNTER — Encounter: Payer: Self-pay | Admitting: Gastroenterology

## 2019-12-22 ENCOUNTER — Encounter: Payer: Self-pay | Admitting: Gastroenterology

## 2019-12-22 ENCOUNTER — Other Ambulatory Visit: Payer: Self-pay

## 2019-12-22 ENCOUNTER — Ambulatory Visit (AMBULATORY_SURGERY_CENTER): Payer: Medicare HMO | Admitting: Gastroenterology

## 2019-12-22 VITALS — BP 138/74 | HR 70 | Temp 97.5°F | Resp 23 | Ht 64.0 in | Wt 175.0 lb

## 2019-12-22 DIAGNOSIS — K449 Diaphragmatic hernia without obstruction or gangrene: Secondary | ICD-10-CM | POA: Diagnosis not present

## 2019-12-22 DIAGNOSIS — K2970 Gastritis, unspecified, without bleeding: Secondary | ICD-10-CM

## 2019-12-22 DIAGNOSIS — K2990 Gastroduodenitis, unspecified, without bleeding: Secondary | ICD-10-CM

## 2019-12-22 DIAGNOSIS — R970 Elevated carcinoembryonic antigen [CEA]: Secondary | ICD-10-CM

## 2019-12-22 DIAGNOSIS — K3189 Other diseases of stomach and duodenum: Secondary | ICD-10-CM

## 2019-12-22 MED ORDER — SODIUM CHLORIDE 0.9 % IV SOLN: 500.0000 mL | Freq: Once | INTRAVENOUS | Status: DC

## 2019-12-22 NOTE — Op Note (Signed)
[facility] Patient Name: Stephanie Leblanc Procedure Date: 12/22/2019 8:31 AM MRN: [id number] Endoscopist: Milus Banister , MD Age: 69 Referring MD:  Date of Birth: 1950-10-24 Gender: Female Account #: [id number] Procedure:                Upper GI endoscopy Indications:              Elevated CEA, h/o colon cancer (very early stage,                            'cured' with polypectomy) Medicines:                Monitored Anesthesia Care Procedure:                Pre-Anesthesia Assessment:                           - Prior to the procedure, a History and Physical                            was performed, and patient medications and                            allergies were reviewed. The patient's tolerance of                            previous anesthesia was also reviewed. The risks                            and benefits of the procedure and the sedation                            options and risks were discussed with the patient.                            All questions were answered, and informed consent                            was obtained. Prior Anticoagulants: The patient has                            taken no previous anticoagulant or antiplatelet                            agents. ASA Grade Assessment: II - A patient with                            mild systemic disease. After reviewing the risks                            and benefits, the patient was deemed in                            satisfactory condition to undergo the procedure.  After obtaining informed consent, the endoscope was                            passed under direct vision. Throughout the                            procedure, the patient's blood pressure, pulse, and                            oxygen saturations were monitored continuously. The                            Endoscope was introduced through the mouth, and                            advanced to the second part of  duodenum. The upper                            GI endoscopy was accomplished without difficulty.                            The patient tolerated the procedure well. Scope In: Scope Out: Findings:                 Mildly erythematous mucosa without bleeding was                            found in the gastric antrum. Biopsies were taken                            with a cold forceps for histology.                           A small hiatal hernia was present.                           The exam was otherwise without abnormality. Complications:            No immediate complications. Estimated blood loss:                            None. Estimated Blood Loss:     Estimated blood loss: none. Impression:               - Erythematous mucosa in the antrum. Biopsied to                            check for H. pylori.                           - Small hiatal hernia.                           - The examination was otherwise normal. Recommendation:           - Patient has a contact number available for  emergencies. The signs and symptoms of potential                            delayed complications were discussed with the                            patient. Return to normal activities tomorrow.                            Written discharge instructions were provided to the                            patient.                           - Resume previous diet.                           - Continue present medications.                           - Await pathology results. If H. pylori is present                            then will treat with appropriate antibiotics.                            Likely repeat CEA in 6 months. Milus Banister, MD 12/22/2019 8:51:44 AM This report has been signed electronically.

## 2019-12-22 NOTE — Progress Notes (Signed)
Report to PACU, RN, vss, BBS= Clear.  

## 2019-12-22 NOTE — Patient Instructions (Signed)
Biopsies taken in stomach for erythematous tissue.  Small hiatal hernia.  Await pathology for final recommendations.  Handouts on findings given to patient.      YOU HAD AN ENDOSCOPIC PROCEDURE TODAY AT [facility]:   Refer to the procedure report that was given to you for any specific questions about what was found during the examination.  If the procedure report does not answer your questions, please call your gastroenterologist to clarify.  If you requested that your care partner not be given the details of your procedure findings, then the procedure report has been included in a sealed envelope for you to review at your convenience later.  YOU SHOULD EXPECT: Some feelings of bloating in the abdomen. Passage of more gas than usual.  Walking can help get rid of the air that was put into your GI tract during the procedure and reduce the bloating. If you had a lower endoscopy (such as a colonoscopy or flexible sigmoidoscopy) you may notice spotting of blood in your stool or on the toilet paper. If you underwent a bowel prep for your procedure, you may not have a normal bowel movement for a few days.  Please Note:  You might notice some irritation and congestion in your nose or some drainage.  This is from the oxygen used during your procedure.  There is no need for concern and it should clear up in a day or so.  SYMPTOMS TO REPORT IMMEDIATELY:   Following upper endoscopy (EGD)  Vomiting of blood or coffee ground material  New chest pain or pain under the shoulder blades  Painful or persistently difficult swallowing  New shortness of breath  Fever of 100F or higher  Black, tarry-looking stools  For urgent or emergent issues, a gastroenterologist can be reached at any hour by calling [phone]. Do not use MyChart messaging for urgent concerns.    DIET:  We do recommend a small meal at first, but then you may proceed to your regular diet.  Drink plenty of fluids but you  should avoid alcoholic beverages for 24 hours.  ACTIVITY:  You should plan to take it easy for the rest of today and you should NOT DRIVE or use heavy machinery until tomorrow (because of the sedation medicines used during the test).    FOLLOW UP: Our staff will call the number listed on your records 48-72 hours following your procedure to check on you and address any questions or concerns that you may have regarding the information given to you following your procedure. If we do not reach you, we will leave a message.  We will attempt to reach you two times.  During this call, we will ask if you have developed any symptoms of COVID 19. If you develop any symptoms (ie: fever, flu-like symptoms, shortness of breath, cough etc.) before then, please call [phone].  If you test positive for Covid 19 in the 2 weeks post procedure, please call and report this information to [place].    If any biopsies were taken you will be contacted by phone or by letter within the next 1-3 weeks.  Please call us at [phone] if you have not heard about the biopsies in 3 weeks.    SIGNATURES/CONFIDENTIALITY: You and/or your care partner have signed paperwork which will be entered into your electronic medical record.  These signatures attest to the fact that that the information above on your After Visit Summary has been reviewed and is understood.  Full responsibility of the confidentiality of this discharge information lies with you and/or your care-partner.

## 2019-12-22 NOTE — Progress Notes (Signed)
Pt's states no medical or surgical changes since previsit or office visit.  JB - temp DT - vitals 

## 2019-12-22 NOTE — Progress Notes (Signed)
Called to room to assist during endoscopic procedure.  Patient ID and intended procedure confirmed with present staff. Received instructions for my participation in the procedure from the performing physician.  

## 2019-12-24 ENCOUNTER — Telehealth: Payer: Self-pay

## 2019-12-24 NOTE — Telephone Encounter (Signed)
  Follow up Call-  Call back number 12/22/2019 05/07/2019 10/30/2018 02/24/2018  Post procedure Call Back phone  # [phone]  Permission to leave phone message Yes Yes Yes Yes  Some recent data might be hidden     Patient questions:  Do you have a fever, pain , or abdominal swelling? No. Pain Score  0 *  Have you tolerated food without any problems? Yes.    Have you been able to return to your normal activities? Yes.    Do you have any questions about your discharge instructions: Diet   No. Medications  No. Follow up visit  No.  Do you have questions or concerns about your Care? No.  Actions: * If pain score is 4 or above: No action needed, pain <4.  1. Have you developed a fever since your procedure? no  2.   Have you had an respiratory symptoms (SOB or cough) since your procedure? no  3.   Have you tested positive for COVID 19 since your procedure no  4.   Have you had any family members/close contacts diagnosed with the COVID 19 since your procedure?  no   If yes to any of these questions please route to Joylene John, RN and Erenest Rasher, RN

## 2020-08-17 ENCOUNTER — Other Ambulatory Visit: Payer: Self-pay | Admitting: Hematology and Oncology

## 2020-08-17 DIAGNOSIS — Z853 Personal history of malignant neoplasm of breast: Secondary | ICD-10-CM

## 2020-08-30 ENCOUNTER — Encounter: Payer: Self-pay | Admitting: Oncology

## 2020-08-30 ENCOUNTER — Other Ambulatory Visit: Payer: Medicare HMO

## 2020-08-30 DIAGNOSIS — R970 Elevated carcinoembryonic antigen [CEA]: Secondary | ICD-10-CM

## 2020-08-31 LAB — CEA: CEA: 4.9 ng/mL — ABNORMAL HIGH

## 2020-09-06 NOTE — Progress Notes (Signed)
[facility]  [address] [place],  [place]  [address] [phone]  Clinic Day:  09/07/2020  Referring physician: Hal Morales, NP   This document serves as a record of services personally performed by Gery Pray, MD. It was created on their behalf by Curry,Lauren E, a trained medical scribe. The creation of this record is based on the scribe's personal observations and the provider's statements to them.   CHIEF COMPLAINT:  CC: History of stage IA hormone receptor positive right breast cancer  Current Treatment:  Surveillance   HISTORY OF PRESENT ILLNESS:  Stephanie Leblanc is a 70 y.o. female with a history of stage IA (T1c N0 M0) hormone receptor positive right breast cancer diagnosed in November 2003. She was treated with lumpectomy.  Pathology revealed a 1.5 cm, grade 2, invasive carcinoma, in the background of extensive DCIS.  Sentinel node was negative.  Estrogen and progesterone receptors were positive and her 2 Neu positive.  At that time, Herceptin was not considered standard adjuvant therapy.  She received adjuvant chemotherapy with Adriamycin and Cytoxan for 4 cycles, followed by radiation to the right breast.  She then received adjuvant hormonal therapy consisting of tamoxifen for 2-1/2 years and letrozole for 2-1/2 years.  She also has osteopenia and took raloxifene for 5 years, which was discontinued in 2014.  She continues calcium and vitamin-D for her osteopenia.  ColoGuard testing was positive, so she underwent colonoscopy in June with Dr. Christella Hartigan.  Apparently, she had a polyp, which contained cancer, but was completely resected for a stage I.  She had repeat colonoscopy since that time, which did not reveal any evidence of recurrence or new polyps.  Review of her family history is negative except for 2 maternal uncles with lung cancer in their 11-70's.  She has a history of non melanoma skin cancer and sees her dermatologist yearly.   Mammogram from December 2020 was clear.  Bone density from December 23rd revealed osteoporosis of the AP spine with a T-score of -2.8.  Right femur showed a T-score of -2.0, and left femur showed a T-score of -1.9.  I recommend that we add in Fosamax 70 mg once weekly.  She notes that she underwent colonoscopy with Dr. Christella Hartigan in August 2020, which was clear, and she will not need this repeated in 3 years.    INTERVAL HISTORY:  Stephanie Leblanc is here for annual follow up and states that she is doing well and denies complaints.  She continues Fosmax 70 mg once weekly.  She states that she had her annual mammogram last week, but states that she will have to return for additional views of the right breast.  She did have an elevated CEA of 4.9 last March at Dr. Christella Hartigan office.  This was repeated at the end of December and this remains stable.  We will repeat a level in 6 months.  She is due for repeat EGD and colonoscopy in August 2023.  Blood counts and chemistries are unremarkable.  Her  appetite is good, and she has gained 11 pounds since her last visit.  She denies fever, chills or other signs of infection.  She denies nausea, vomiting, bowel issues, or abdominal pain.  She denies sore throat, cough, dyspnea, or chest pain.  REVIEW OF SYSTEMS:  Review of Systems  Constitutional: Negative.   HENT:  Negative.   Eyes: Negative.   Respiratory: Negative.   Cardiovascular: Negative.   Gastrointestinal: Negative.   Endocrine: Negative.  Genitourinary: Negative.    Musculoskeletal: Negative.   Skin: Negative.   Neurological: Negative.   Hematological: Negative.   Psychiatric/Behavioral: Negative.      VITALS:  Blood pressure (!) 175/83, pulse 76, temperature 98.2 F (36.8 C), temperature source Oral, resp. rate 18, height 5\' 4"  (1.626 m), weight 182 lb 9.6 oz (82.8 kg), SpO2 96 %.  Wt Readings from Last 3 Encounters:  09/07/20 182 lb 9.6 oz (82.8 kg)  12/22/19 175 lb (79.4 kg)  12/17/19 175 lb (79.4 kg)     Body mass index is 31.34 kg/m.  Performance status (ECOG): 0 - Asymptomatic  PHYSICAL EXAM:  Physical Exam Constitutional:      General: She is not in acute distress.    Appearance: Normal appearance. She is normal weight.  HENT:     Head: Normocephalic and atraumatic.  Eyes:     General: No scleral icterus.    Extraocular Movements: Extraocular movements intact.     Conjunctiva/sclera: Conjunctivae normal.     Pupils: Pupils are equal, round, and reactive to light.  Cardiovascular:     Rate and Rhythm: Normal rate and regular rhythm.     Pulses: Normal pulses.     Heart sounds: Normal heart sounds. No murmur heard. No friction rub. No gallop.   Pulmonary:     Effort: Pulmonary effort is normal. No respiratory distress.     Breath sounds: Normal breath sounds.  Chest:     Comments: Mild scar tissue of the right breast and right axilla.  Both breasts are without masses. Abdominal:     General: Bowel sounds are normal. There is no distension.     Palpations: Abdomen is soft. There is no mass.     Tenderness: There is no abdominal tenderness.  Musculoskeletal:        General: Normal range of motion.     Cervical back: Normal range of motion and neck supple.     Right lower leg: No edema.     Left lower leg: No edema.  Lymphadenopathy:     Cervical: No cervical adenopathy.  Skin:    General: Skin is warm and dry.  Neurological:     General: No focal deficit present.     Mental Status: She is alert and oriented to person, place, and time. Mental status is at baseline.  Psychiatric:        Mood and Affect: Mood normal.        Behavior: Behavior normal.        Thought Content: Thought content normal.        Judgment: Judgment normal.     LABS:  No flowsheet data found. CMP Latest Ref Rng & Units 12/13/2019 03/09/2018  BUN 6 - 23 mg/dL 17 19  Creatinine 0.40 - 1.20 mg/dL 0.68 0.70     STUDIES:  No results found.   Allergies:  Allergies  Allergen Reactions  .  Compazine [Prochlorperazine Edisylate]     Muscle contractions    Current Medications: Current Outpatient Medications  Medication Sig Dispense Refill  . acetaminophen (TYLENOL) 500 MG tablet Take 1,000 mg by mouth every 8 (eight) hours as needed for moderate pain.    Marland Kitchen alendronate (FOSAMAX) 70 MG tablet Take 70 mg by mouth once a week.    . Calcium Carbonate-Vit D-Min (CALCIUM 1200 PO) Take 1,200 mg by mouth daily.     . cholecalciferol (VITAMIN D) 1000 units tablet Take 1,000 Units by mouth daily.    Marland Kitchen ezetimibe (  ZETIA) 10 MG tablet Take 10 mg by mouth. Three times a week not daily    . ibuprofen (ADVIL,MOTRIN) 800 MG tablet Take one tablet every 8 hours as needed for pain.    . meloxicam (MOBIC) 7.5 MG tablet Take by mouth.    . pantoprazole (PROTONIX) 40 MG tablet Take 40 mg by mouth daily.    . sertraline (ZOLOFT) 50 MG tablet Take 50 mg by mouth daily.    Marland Kitchen spironolactone (ALDACTONE) 25 MG tablet Take 25 mg by mouth daily.     . valsartan (DIOVAN) 320 MG tablet Take 320 mg by mouth daily.      Current Facility-Administered Medications  Medication Dose Route Frequency Provider Last Rate Last Admin  . 0.9 %  sodium chloride infusion  500 mL Intravenous Once Milus Banister, MD         ASSESSMENT & PLAN:   Assessment:   1. Remote history of stage IA hormone and her 2 Neu receptor positive breast cancer.  She is 17 years postop and remains without evidence of recurrence.  2. Stage I colon cancer.  She underwent colonoscopy in August 2020, as was advised not to have this repeated for 3 years.  3. Osteoporosis, as of recent bone density, she is currently on calcium and vitamin-D.  We added in Fosamax 70 mg once weekly.  She will be due for repeat bone density scan in December 2022.  4.  Mildly elevated CEA of 4.9, stable over a 2 year period.  We will continue to monitor this every 6 months.  Plan: She knows to continue Fosamax 70 mg once weekly in addition to her oral calcium  with vitamin D.  She will return in 6 months for CEA.  We will plan to see her back in 1 year with CBC, comprehensive metabolic panel, bilateral mammogram and bone density scan.  The patient understands the plans discussed today and is in agreement with them.  She knows to contact our office if she develops concerns regarding her breast cancer.   I provided 30 minutes of face-to-face time during this this encounter and > 50% was spent counseling as documented under my assessment and plan.    Derwood Kaplan, MD [facility] AT [facility] [address] [place] [address] Dept: [phone] Dept Fax: [phone]   I, Rita Ohara, am acting as scribe for Derwood Kaplan, MD  I have reviewed this report as typed by the medical scribe, and it is complete and accurate.

## 2020-09-07 ENCOUNTER — Inpatient Hospital Stay: Payer: Medicare HMO | Attending: Oncology

## 2020-09-07 ENCOUNTER — Encounter: Payer: Self-pay | Admitting: Oncology

## 2020-09-07 ENCOUNTER — Telehealth: Payer: Self-pay | Admitting: Oncology

## 2020-09-07 ENCOUNTER — Inpatient Hospital Stay (INDEPENDENT_AMBULATORY_CARE_PROVIDER_SITE_OTHER): Payer: Medicare HMO | Admitting: Oncology

## 2020-09-07 ENCOUNTER — Other Ambulatory Visit: Payer: Self-pay | Admitting: Hematology and Oncology

## 2020-09-07 ENCOUNTER — Other Ambulatory Visit: Payer: Self-pay | Admitting: Oncology

## 2020-09-07 ENCOUNTER — Other Ambulatory Visit: Payer: Self-pay

## 2020-09-07 VITALS — BP 175/83 | HR 76 | Temp 98.2°F | Resp 18 | Ht 64.0 in | Wt 182.6 lb

## 2020-09-07 DIAGNOSIS — C187 Malignant neoplasm of sigmoid colon: Secondary | ICD-10-CM | POA: Diagnosis not present

## 2020-09-07 DIAGNOSIS — I890 Lymphedema, not elsewhere classified: Secondary | ICD-10-CM | POA: Diagnosis not present

## 2020-09-07 DIAGNOSIS — Z853 Personal history of malignant neoplasm of breast: Secondary | ICD-10-CM

## 2020-09-07 LAB — BASIC METABOLIC PANEL
BUN: 16 (ref 4–21)
CO2: 21 (ref 13–22)
Chloride: 108 (ref 99–108)
Creatinine: 0.6 (ref 0.5–1.1)
Glucose: 96
Potassium: 3.8 (ref 3.4–5.3)
Sodium: 138 (ref 137–147)

## 2020-09-07 LAB — CBC AND DIFFERENTIAL
HCT: 42 (ref 36–46)
Hemoglobin: 14.1 (ref 12.0–16.0)
Neutrophils Absolute: 3.01
Platelets: 203 (ref 150–399)
WBC: 4.7

## 2020-09-07 LAB — COMPREHENSIVE METABOLIC PANEL
Albumin: 4.8 (ref 3.5–5.0)
Calcium: 10.1 (ref 8.7–10.7)

## 2020-09-07 LAB — CBC: RBC: 4.64 (ref 3.87–5.11)

## 2020-09-07 LAB — HEPATIC FUNCTION PANEL
ALT: 36 — AB (ref 7–35)
AST: 34 (ref 13–35)
Alkaline Phosphatase: 52 (ref 25–125)
Bilirubin, Total: 0.6

## 2020-09-07 NOTE — Telephone Encounter (Signed)
Per 1/6 los next appt given to patient 

## 2020-09-08 ENCOUNTER — Telehealth: Payer: Self-pay

## 2020-09-08 NOTE — Telephone Encounter (Signed)
-----   Message from Derwood Kaplan, MD sent at 09/08/2020  7:49 AM EST ----- Regarding: problem list The problem list states she had melanoma.  My note says non-melanoma skin cancer.  I don't know who entered that dx but big difference.  Would you call pt and check whether there is something I don't know about, and I will figure out how to fix this  Also, Rashidah [place], I left a stack of forms filled out in the box in my office

## 2020-09-08 NOTE — Telephone Encounter (Signed)
The Melanoma diagnosis was before she starting seeing you for the breast. It was years ago she said.

## 2020-09-11 DIAGNOSIS — I890 Lymphedema, not elsewhere classified: Secondary | ICD-10-CM | POA: Insufficient documentation

## 2020-09-11 HISTORY — DX: Lymphedema, not elsewhere classified: I89.0

## 2020-10-05 ENCOUNTER — Other Ambulatory Visit: Payer: Self-pay | Admitting: Oncology

## 2021-03-15 ENCOUNTER — Other Ambulatory Visit: Payer: Self-pay

## 2021-03-15 ENCOUNTER — Inpatient Hospital Stay: Payer: Medicare HMO | Attending: Oncology

## 2021-03-15 DIAGNOSIS — Z853 Personal history of malignant neoplasm of breast: Secondary | ICD-10-CM | POA: Diagnosis not present

## 2021-03-15 DIAGNOSIS — Z85038 Personal history of other malignant neoplasm of large intestine: Secondary | ICD-10-CM | POA: Diagnosis present

## 2021-03-16 LAB — CEA: CEA: 5.8 ng/mL — ABNORMAL HIGH (ref 0.0–4.7)

## 2021-03-26 ENCOUNTER — Other Ambulatory Visit: Payer: Self-pay | Admitting: Hematology and Oncology

## 2021-03-26 DIAGNOSIS — M810 Age-related osteoporosis without current pathological fracture: Secondary | ICD-10-CM

## 2021-04-05 ENCOUNTER — Other Ambulatory Visit: Payer: Self-pay | Admitting: Oncology

## 2021-04-05 ENCOUNTER — Telehealth: Payer: Self-pay

## 2021-04-05 ENCOUNTER — Encounter: Payer: Self-pay | Admitting: Oncology

## 2021-04-05 DIAGNOSIS — C50919 Malignant neoplasm of unspecified site of unspecified female breast: Secondary | ICD-10-CM

## 2021-04-05 DIAGNOSIS — C187 Malignant neoplasm of sigmoid colon: Secondary | ICD-10-CM

## 2021-04-05 NOTE — Telephone Encounter (Signed)
-----   Message from Derwood Kaplan, MD sent at 04/05/2021  1:49 PM EDT ----- Regarding: RE: CEA result Contact: [phone] I think that is reasonable, I will order and then plan to see her a couple days later to review ----- Message ----- From: Dairl Ponder, RN Sent: 04/05/2021  11:11 AM EDT To: Derwood Kaplan, MD Subject: RE: CEA result                                 I spoke with pt. She is nervous about the level increasing and would like scan done.     ----- Message ----- From: Derwood Kaplan, MD Sent: 04/04/2021   3:23 PM EDT To: Dairl Ponder, RN Subject: RE: CEA result                                 Yes, I am aware, just trying to decide what to do about it.  It went up 1 point but has been mildly elevated for well over a year with very little change.  I would rec repeating sooner than Jan, I.e. in 2-3 months.  But if she is nervous, we could do CT scans. ----- Message ----- From: Dairl Ponder, RN Sent: 04/04/2021  11:51 AM EDT To: Derwood Kaplan, MD Subject: CEA result                                     Pt called to request lab results from July 2022, & wanted to make sure you had seen it.

## 2021-04-05 NOTE — Telephone Encounter (Addendum)
I spoke with pt. She is nervous about the level increasing and would like scan done. I will send message back to Dr Hinton Rao.  ----- Message from Derwood Kaplan, MD sent at 04/04/2021  3:20 PM EDT ----- Regarding: RE: CEA result Contact: [phone] Yes, I am aware, just trying to decide what to do about it.  It went up 1 point but has been mildly elevated for well over a year with very little change.  I would rec repeating sooner than Jan, I.e. in 2-3 months.  But if she is nervous, we could do CT scans. ----- Message ----- From: Dairl Ponder, RN Sent: 04/04/2021  11:51 AM EDT To: Derwood Kaplan, MD Subject: CEA result                                     Pt called to request lab results from July 2022, & wanted to make sure you had seen it.

## 2021-05-15 ENCOUNTER — Encounter: Payer: Self-pay | Admitting: Oncology

## 2021-05-16 ENCOUNTER — Encounter: Payer: Self-pay | Admitting: Oncology

## 2021-05-21 ENCOUNTER — Telehealth: Payer: Self-pay

## 2021-05-21 NOTE — Telephone Encounter (Signed)
-----   Message from Derwood Kaplan, MD sent at 05/21/2021 10:01 AM EDT ----- Regarding: call pt Tell her CT scans are all clear, no sign of cancer.  She does have fatty liver.  I don't have an explanation for the elevated CEA, we will check again when I see her in Jan. Please send copy of scans to Dr. Ardis Hughs and her PCP

## 2021-05-23 ENCOUNTER — Telehealth: Payer: Self-pay

## 2021-05-23 NOTE — Telephone Encounter (Signed)
Patient aware.

## 2021-05-23 NOTE — Telephone Encounter (Signed)
-----   Message from Belva Chimes, LPN sent at 01/18/3436  3:02 PM EDT ----- Regarding: FW: call pt  ----- Message ----- From: Derwood Kaplan, MD Sent: 05/21/2021  10:03 AM EDT To: Belva Chimes, LPN Subject: call pt                                        Tell her CT scans are all clear, no sign of cancer.  She does have fatty liver.  I don't have an explanation for the elevated CEA, we will check again when I see her in Jan. Please send copy of scans to Dr. Ardis Hughs and her PCP

## 2021-05-29 DIAGNOSIS — M545 Low back pain, unspecified: Secondary | ICD-10-CM

## 2021-05-29 HISTORY — DX: Low back pain, unspecified: M54.50

## 2021-05-29 IMAGING — CT CT ABD-PELV W/ CM
2 of 5 series · 15 of 46 positions shown, 17 images · IV contrast (APPLIED)
Comparison: 03/10/2018.

CLINICAL DATA: Colon cancer, increasing CEA. History of melanoma.
History of right breast cancer.

EXAM:
CT ABDOMEN AND PELVIS WITH CONTRAST
TECHNIQUE: Multidetector CT imaging of the abdomen and pelvis was performed
using the standard protocol following bolus administration of
intravenous contrast.
CONTRAST:  100mL OMNIPAQUE IOHEXOL 300 MG/ML  SOLN

[Series 2: axial st · axial · 0.73mm/px · z∈[+937,+1367]mm · 12 of 102 slices shown, 14 images]
[im 8/102  soft-tissue]
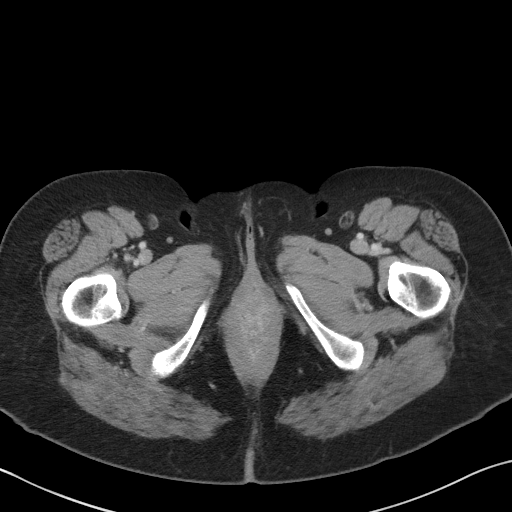
[im 8/102  bone]
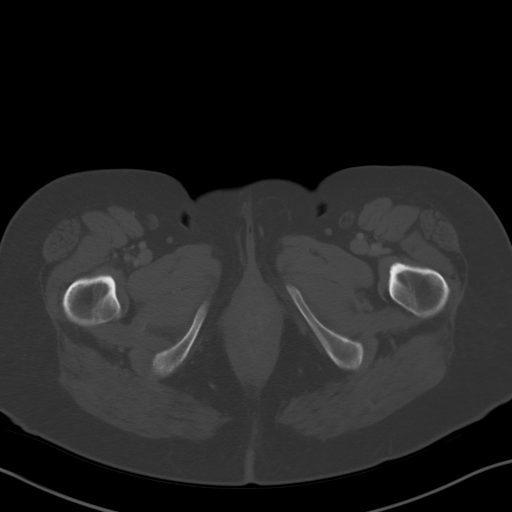
[im 15/102  soft-tissue]
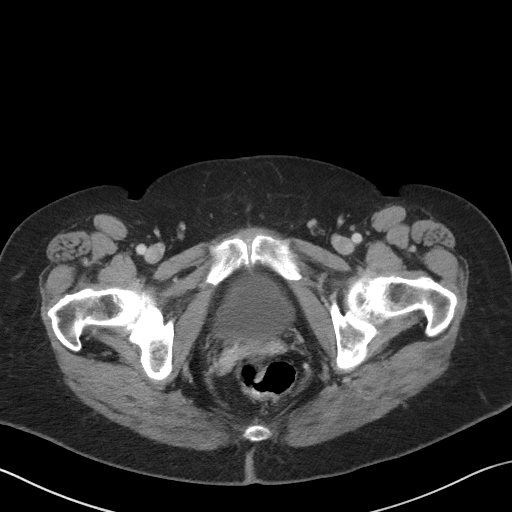
[im 22/102  soft-tissue]
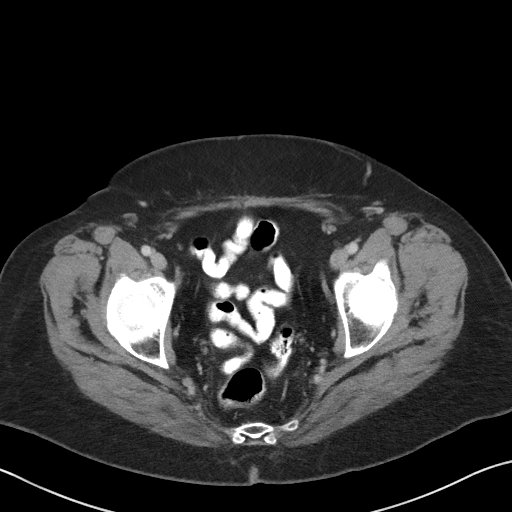
[im 29/102  soft-tissue]
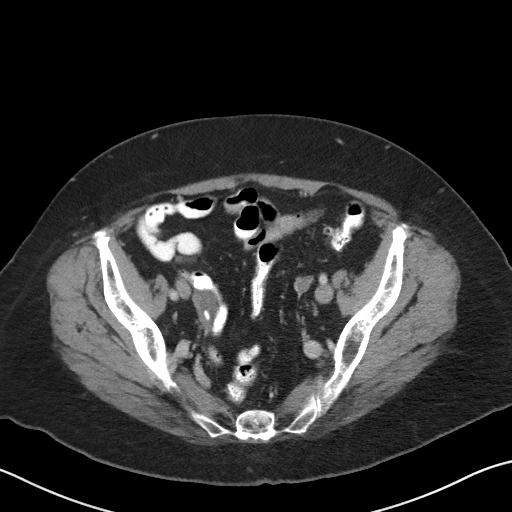
[im 37/102  soft-tissue]
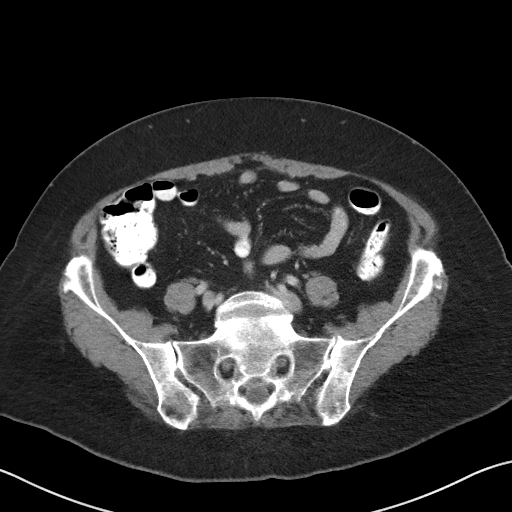
[im 44/102  soft-tissue]
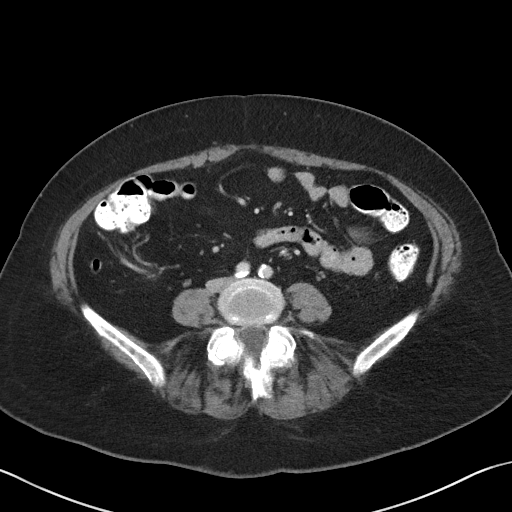
[im 58/102  soft-tissue]
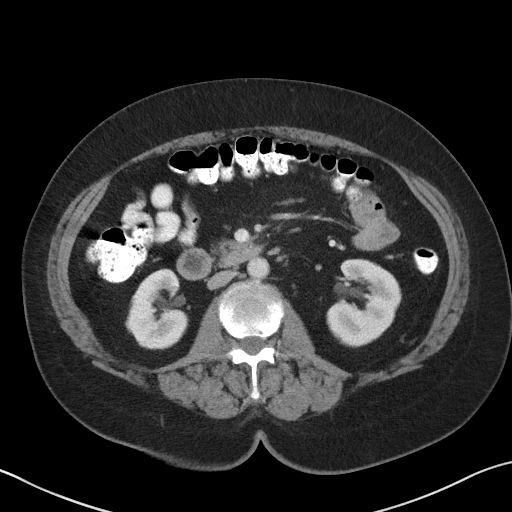
[im 65/102  soft-tissue]
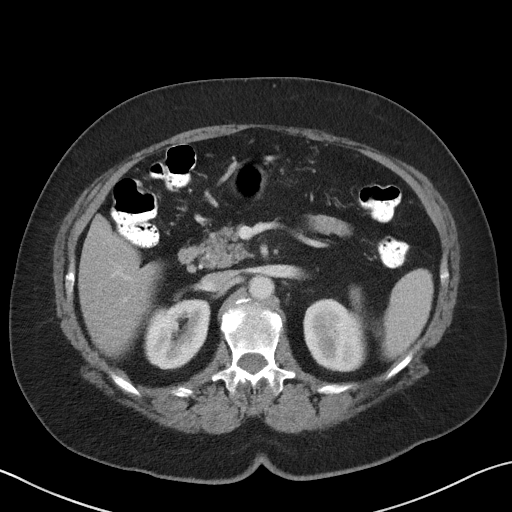
[im 73/102  soft-tissue]
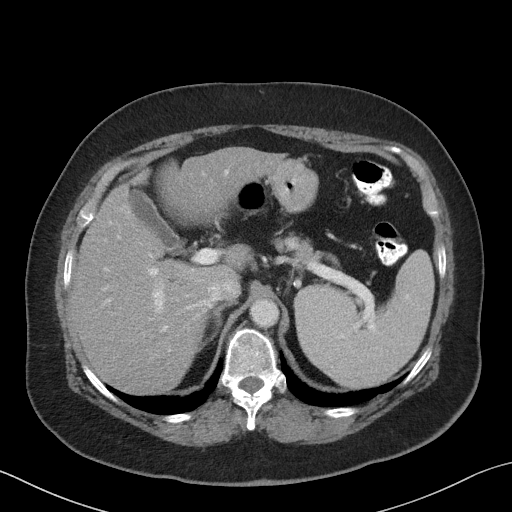
[im 73/102  bone]
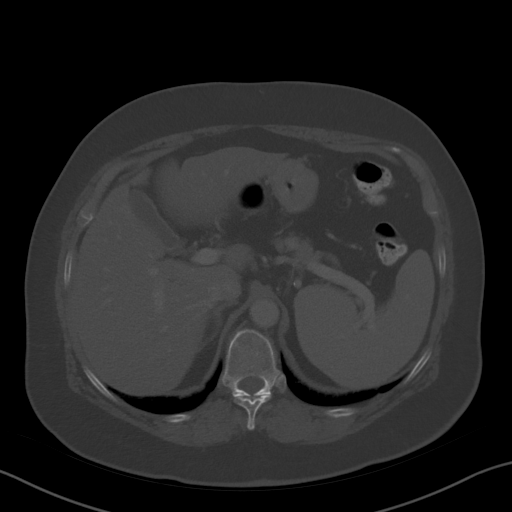
[im 80/102  soft-tissue]
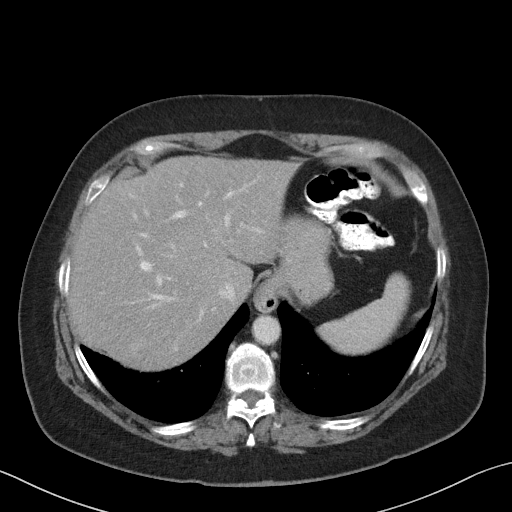
[im 87/102  soft-tissue]
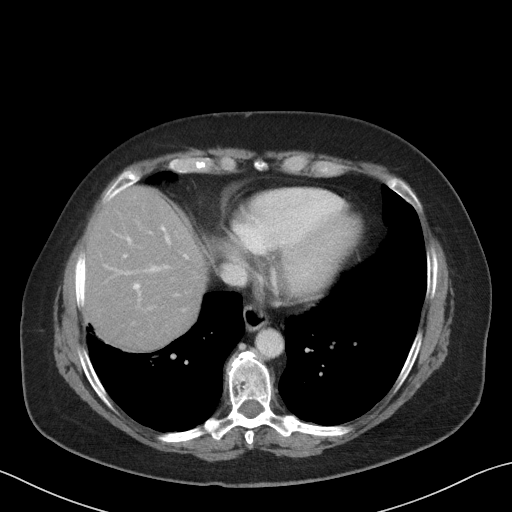
[im 94/102  soft-tissue]
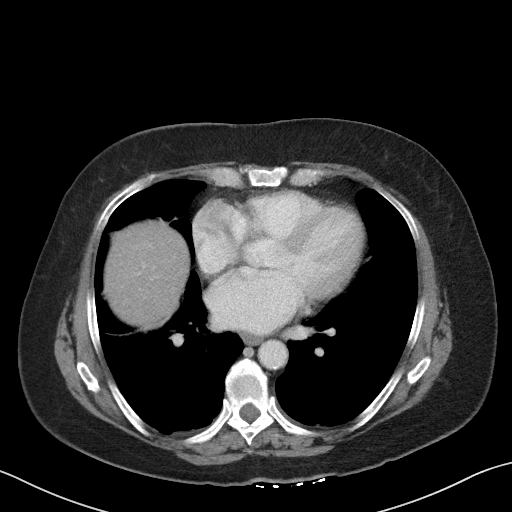

[Series 4: coronal st · coronal · 0.69mm/px · 3 of 96 slices shown]
[im 32/96  soft-tissue]
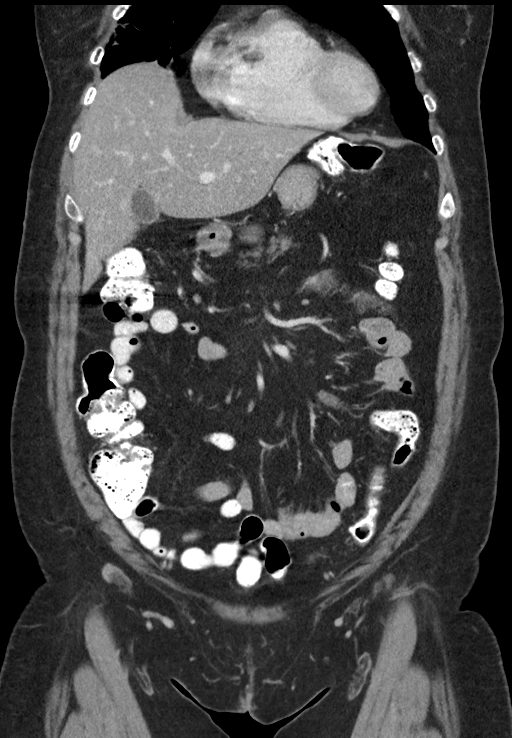
[im 43/96  soft-tissue]
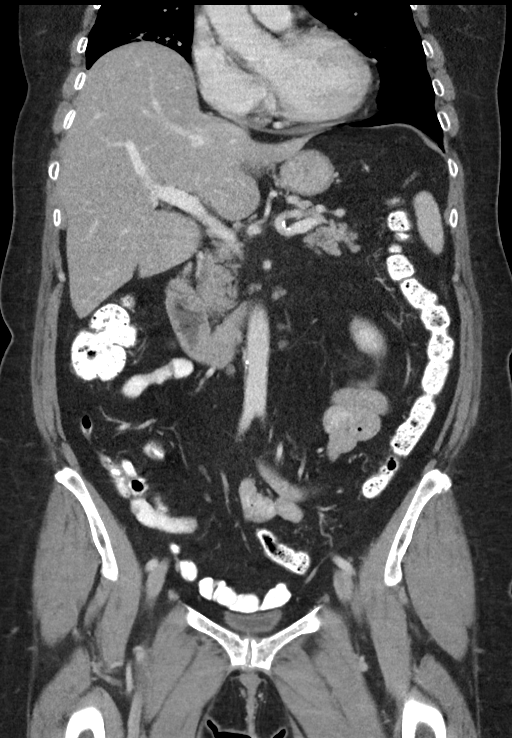
[im 53/96  soft-tissue]
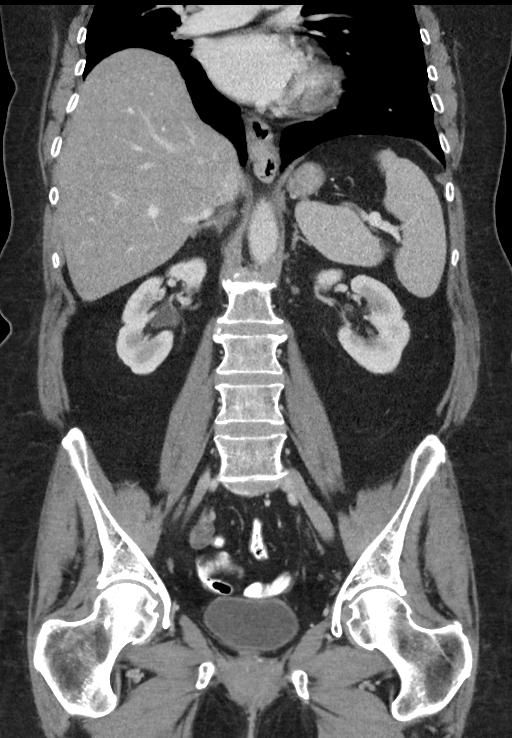

[15 of 46 positions shown; findings below may reference images not displayed]

FINDINGS: Lower chest: Mild volume loss in the right middle lobe and both
lower lobes. Dependent atelectasis bilaterally. Heart is at the
upper limits of normal in size to mildly enlarged. No pericardial or
pleural effusion. Esophagus is grossly unremarkable.

Hepatobiliary: Liver is decreased in attenuation diffusely.
Subcentimeter low-attenuation lesion in the caudate is too small to
characterize but unchanged, favoring a benign lesion. Liver and
gallbladder are otherwise unremarkable. No biliary ductal
dilatation.

Pancreas: Negative.

Spleen: Negative.

Adrenals/Urinary Tract: Adrenal glands and kidneys are unremarkable.
Ureters are decompressed. Bladder is grossly unremarkable.

Stomach/Bowel: Tiny hiatal hernia. Stomach is largely decompressed.
Stomach, small bowel, appendix and colon are otherwise unremarkable.

Vascular/Lymphatic: Atherosclerotic calcification of the aorta
without aneurysm. Scattered lymph nodes are not enlarged by CT size
criteria.

Reproductive: Hysterectomy.  No adnexal mass.

Other: No free fluid.  Mesenteries and peritoneum are unremarkable.

Musculoskeletal: Sclerotic lesions in the medial left iliac wing are
unchanged from 03/10/2018. Degenerative changes in the spine. No
worrisome lytic or sclerotic lesions. Grade 2 anterolisthesis of L5
on S1. Chronic bilateral L5 pars defects. Secondary degenerative
disc disease.
IMPRESSION: 1. No evidence of metastatic disease.
2. Hepatic steatosis.
3.  Aortic atherosclerosis ([id number]).

## 2021-08-08 ENCOUNTER — Other Ambulatory Visit: Payer: Self-pay | Admitting: Oncology

## 2021-08-08 ENCOUNTER — Telehealth: Payer: Self-pay

## 2021-08-08 DIAGNOSIS — M8580 Other specified disorders of bone density and structure, unspecified site: Secondary | ICD-10-CM

## 2021-08-08 NOTE — Telephone Encounter (Signed)
Pt called to ask if Dr Hinton Rao had ordered her bone density, as her last one was in 2020. She also needs to move her mammo appt due to scheduling conflict. I sent this message to Dr Hinton Rao and [place].

## 2021-08-21 DIAGNOSIS — M4316 Spondylolisthesis, lumbar region: Secondary | ICD-10-CM

## 2021-08-21 HISTORY — DX: Spondylolisthesis, lumbar region: M43.16

## 2021-09-07 ENCOUNTER — Ambulatory Visit: Payer: Medicare HMO | Admitting: Oncology

## 2021-09-07 ENCOUNTER — Other Ambulatory Visit: Payer: Medicare HMO

## 2021-09-14 NOTE — Progress Notes (Signed)
[facility]  [address] [place],  [place]  [address] [phone]  Clinic Day:  09/20/2021  Referring physician: Charlynn Court, NP   This document serves as a record of services personally performed by Hosie Poisson, MD. It was created on their behalf by Curry,Lauren E, a trained medical scribe. The creation of this record is based on the scribe's personal observations and the provider's statements to them.  CHIEF COMPLAINT:  CC: History of stage IA hormone receptor positive right breast cancer  Current Treatment:  Surveillance   HISTORY OF PRESENT ILLNESS:  Stephanie Leblanc is a 71 y.o. female with a history of stage IA (T1c N0 M0) hormone receptor positive right breast cancer diagnosed in November 2003. She was treated with lumpectomy.  Pathology revealed a 1.5 cm, grade 2, invasive carcinoma, in the background of extensive DCIS.  Sentinel node was negative.  Estrogen and progesterone receptors were positive and her 2 Neu positive.  At that time, Herceptin was not considered standard adjuvant therapy.  She received adjuvant chemotherapy with Adriamycin and Cytoxan for 4 cycles, followed by radiation to the right breast.  She then received adjuvant hormonal therapy consisting of tamoxifen for 2-1/2 years and letrozole for 2-1/2 years.  She also has osteopenia and took raloxifene for 5 years, which was discontinued in 2014.  She continues calcium and vitamin-D for her osteopenia.  ColoGuard testing was positive, so she underwent colonoscopy in June with Dr. Ardis Hughs.  Apparently, she had a polyp, which contained cancer, but was completely resected for a stage I.  She had repeat colonoscopy since that time, which did not reveal any evidence of recurrence or new polyps.  Review of her family history is negative except for 2 maternal uncles with lung cancer in their 30-70's.  She has a history of non melanoma skin cancer and sees her dermatologist yearly.   Mammogram from December 2020 was clear.  Bone density from December 2020 revealed osteoporosis of the AP spine with a T-score of -2.8.  Right femur showed a T-score of -2.0, and left femur showed a T-score of -1.9.  I recommend that we add in Fosamax 70 mg once weekly.  She notes that she underwent colonoscopy with Dr. Ardis Hughs in August 2020, which was clear, and she will not need this repeated in 3 years.  She did have an elevated CEA of 4.9 in March 2021 at Dr. Ardis Hughs office.  This was repeated at the end of December and was stable. It did go up to 5.8 in July 2022, and was 5.6 in September. She will be due for repeat EGD and colonoscopy in August 2023.  INTERVAL HISTORY:  Stephanie Leblanc is here for annual follow up and states that she has been well other than back pain and sciatica. She is scheduled to start physical therapy today. Annual bilateral mammogram from January 16th revealed focal asymmetry in the left breast which is indeterminate. Additional imaging with diagnostic mammogram and ultrasound has been scheduled for Wednesday next week. She continues Fosmax 70 mg once weekly and is due for bone density, which we will schedule. Blood counts and chemistries are unremarkable. Her  appetite is good, and her weight is stable since her last visit.  She denies fever, chills or other signs of infection.  She denies nausea, vomiting, bowel issues, or abdominal pain.  She denies sore throat, cough, dyspnea, or chest pain.  REVIEW OF SYSTEMS:  Review of Systems  Constitutional: Negative.  Negative  for appetite change, chills, fatigue, fever and unexpected weight change.  HENT:  Negative.    Eyes: Negative.   Respiratory: Negative.  Negative for chest tightness, cough, hemoptysis, shortness of breath and wheezing.   Cardiovascular: Negative.  Negative for chest pain, leg swelling and palpitations.  Gastrointestinal: Negative.  Negative for abdominal distention, abdominal pain, blood in stool, constipation,  diarrhea, nausea and vomiting.  Endocrine: Negative.   Genitourinary: Negative.  Negative for difficulty urinating, dysuria, frequency and hematuria.   Musculoskeletal:  Positive for arthralgias and back pain. Negative for flank pain, gait problem and myalgias.  Skin: Negative.   Neurological: Negative.  Negative for dizziness, extremity weakness, gait problem, headaches, light-headedness, numbness, seizures and speech difficulty.  Hematological: Negative.   Psychiatric/Behavioral: Negative.  Negative for depression and sleep disturbance. The patient is not nervous/anxious.     VITALS:  Blood pressure 136/61, pulse 89, temperature 98.3 F (36.8 C), temperature source Oral, resp. rate 16, height 5\' 4"  (1.626 m), weight 182 lb 14.4 oz (83 kg), SpO2 95 %.  Wt Readings from Last 3 Encounters:  09/20/21 182 lb 14.4 oz (83 kg)  09/07/20 182 lb 9.6 oz (82.8 kg)  12/22/19 175 lb (79.4 kg)    Body mass index is 31.39 kg/m.  Performance status (ECOG): 1 - Symptomatic but completely ambulatory  PHYSICAL EXAM:  Physical Exam Constitutional:      General: She is not in acute distress.    Appearance: Normal appearance. She is normal weight.  HENT:     Head: Normocephalic and atraumatic.  Eyes:     General: No scleral icterus.    Extraocular Movements: Extraocular movements intact.     Conjunctiva/sclera: Conjunctivae normal.     Pupils: Pupils are equal, round, and reactive to light.  Cardiovascular:     Rate and Rhythm: Normal rate and regular rhythm.     Pulses: Normal pulses.     Heart sounds: Normal heart sounds. No murmur heard.   No friction rub. No gallop.  Pulmonary:     Effort: Pulmonary effort is normal. No respiratory distress.     Breath sounds: Normal breath sounds.  Chest:     Comments: Scar at 1 o'clock in the right breast which is well healed and right axilla. No masses in either breast.  Abdominal:     General: Bowel sounds are normal. There is no distension.      Palpations: Abdomen is soft. There is no hepatomegaly, splenomegaly or mass.     Tenderness: There is no abdominal tenderness.     Comments: She has a well healed scar in the skin of the epigastric area.  Musculoskeletal:        General: Normal range of motion.     Cervical back: Normal range of motion and neck supple.     Right lower leg: No edema.     Left lower leg: No edema.  Lymphadenopathy:     Cervical: No cervical adenopathy.  Skin:    General: Skin is warm and dry.  Neurological:     General: No focal deficit present.     Mental Status: She is alert and oriented to person, place, and time. Mental status is at baseline.  Psychiatric:        Mood and Affect: Mood normal.        Behavior: Behavior normal.        Thought Content: Thought content normal.        Judgment: Judgment normal.  LABS:   CBC Latest Ref Rng & Units 09/20/2021 09/07/2020  WBC - 5.0 4.7  Hemoglobin 12.0 - 16.0 13.6 14.1  Hematocrit 36 - 46 41 42  Platelets 150 - 399 252 203   CMP Latest Ref Rng & Units 09/20/2021 09/07/2020 12/13/2019  BUN 4 - 21 19 16 17   Creatinine 0.5 - 1.1 0.8 0.6 0.68  Sodium 137 - 147 139 138 -  Potassium 3.4 - 5.3 3.6 3.8 -  Chloride 99 - 108 106 108 -  CO2 13 - 22 23(A) 21 -  Calcium 8.7 - 10.7 9.5 10.1 -  Alkaline Phos 25 - 125 53 52 -  AST 13 - 35 29 34 -  ALT 7 - 35 29 36(A) -     STUDIES:  No results found.   Allergies:  Allergies  Allergen Reactions   Prochlorperazine     Other reaction(s): Muscle Cramps   Compazine [Prochlorperazine Edisylate]     Muscle contractions    Current Medications: Current Outpatient Medications  Medication Sig Dispense Refill   acetaminophen (TYLENOL) 500 MG tablet Take 1,000 mg by mouth every 8 (eight) hours as needed for moderate pain.     alendronate (FOSAMAX) 70 MG tablet TAKE ONE TABLET once weekly 4 tablet 5   Calcium Carbonate-Vit D-Min (CALCIUM 1200 PO) Take 1,200 mg by mouth daily.      cholecalciferol (VITAMIN D)  1000 units tablet Take 1,000 Units by mouth daily.     ezetimibe (ZETIA) 10 MG tablet Take 10 mg by mouth. Three times a week not daily     ibuprofen (ADVIL,MOTRIN) 800 MG tablet Take one tablet every 8 hours as needed for pain.     meloxicam (MOBIC) 7.5 MG tablet Take by mouth.     pantoprazole (PROTONIX) 40 MG tablet Take 40 mg by mouth daily.     sertraline (ZOLOFT) 50 MG tablet Take 50 mg by mouth daily.     spironolactone (ALDACTONE) 25 MG tablet Take 25 mg by mouth daily.      valsartan (DIOVAN) 320 MG tablet Take 320 mg by mouth daily.      Current Facility-Administered Medications  Medication Dose Route Frequency Provider Last Rate Last Admin   0.9 %  sodium chloride infusion  500 mL Intravenous Once Milus Banister, MD         ASSESSMENT & PLAN:   Assessment:   1. Remote history of stage IA hormone and her 2 Neu receptor positive breast cancer.  She is 19 years postop and remains without evidence of recurrence.  2. Stage I colon cancer.  She underwent colonoscopy in August 2020, as was advised not to have this repeated for 3 years.  3. History of melanoma of the trunk many years ago. She also has had multiple other non melanoma skin cancers throughout the years.  4. Osteoporosis, she is currently on calcium and vitamin-D, and well as Fosamax 70 mg once weekly.  As she is due for repeat bone density scan we will schedule this for her.  5.  Mildly elevated CEA, fairly stable over a 2 year period.  CT imaging from September 2022 was negative. We will continue to monitor this every 6 months.  6. Asymmetry of the left breast which will be evaluated further.  Plan: Annual mammogram has revealed asymmetry within the left breast which is indeterminate. She is scheduled for diagnostic imaging as well as an ultrasound next week. As she is past due for bone density,  we will schedule this for her. She knows to continue Fosamax 70 mg once weekly in addition to her oral calcium with  vitamin D.  If all is well, we will plan to see her back in 1 year with bilateral mammogram.  However, if diagnostic imaging remains suspicious, we will see her back sooner. If the CEA is elevated, we will repeat it again in 6 months. She had negative CT scans in September 2022. The patient understands the plans discussed today and is in agreement with them.  She knows to contact our office if she develops concerns regarding her breast cancer.   I provided 15 minutes of face-to-face time during this this encounter and > 50% was spent counseling as documented under my assessment and plan.    Derwood Kaplan, MD [facility] [address] [place] [address] Dept: [phone] Dept Fax: [phone]   I, Rita Ohara, am acting as scribe for Derwood Kaplan, MD  I have reviewed this report as typed by the medical scribe, and it is complete and accurate.

## 2021-09-20 ENCOUNTER — Encounter: Payer: Self-pay | Admitting: Oncology

## 2021-09-20 ENCOUNTER — Inpatient Hospital Stay: Payer: Medicare HMO | Attending: Oncology

## 2021-09-20 ENCOUNTER — Other Ambulatory Visit: Payer: Self-pay | Admitting: Hematology and Oncology

## 2021-09-20 ENCOUNTER — Other Ambulatory Visit: Payer: Self-pay | Admitting: Oncology

## 2021-09-20 ENCOUNTER — Inpatient Hospital Stay: Payer: Medicare HMO | Admitting: Oncology

## 2021-09-20 ENCOUNTER — Other Ambulatory Visit: Payer: Self-pay

## 2021-09-20 ENCOUNTER — Telehealth: Payer: Self-pay | Admitting: Oncology

## 2021-09-20 VITALS — BP 136/61 | HR 89 | Temp 98.3°F | Resp 16 | Ht 64.0 in | Wt 182.9 lb

## 2021-09-20 DIAGNOSIS — Z170 Estrogen receptor positive status [ER+]: Secondary | ICD-10-CM

## 2021-09-20 DIAGNOSIS — Z79899 Other long term (current) drug therapy: Secondary | ICD-10-CM | POA: Insufficient documentation

## 2021-09-20 DIAGNOSIS — Z853 Personal history of malignant neoplasm of breast: Secondary | ICD-10-CM

## 2021-09-20 DIAGNOSIS — C50911 Malignant neoplasm of unspecified site of right female breast: Secondary | ICD-10-CM | POA: Diagnosis not present

## 2021-09-20 DIAGNOSIS — C187 Malignant neoplasm of sigmoid colon: Secondary | ICD-10-CM

## 2021-09-20 DIAGNOSIS — Z85038 Personal history of other malignant neoplasm of large intestine: Secondary | ICD-10-CM | POA: Diagnosis present

## 2021-09-20 DIAGNOSIS — N6489 Other specified disorders of breast: Secondary | ICD-10-CM | POA: Insufficient documentation

## 2021-09-20 DIAGNOSIS — Z85820 Personal history of malignant melanoma of skin: Secondary | ICD-10-CM | POA: Insufficient documentation

## 2021-09-20 LAB — BASIC METABOLIC PANEL
BUN: 19 (ref 4–21)
CO2: 23 — AB (ref 13–22)
Chloride: 106 (ref 99–108)
Creatinine: 0.8 (ref 0.5–1.1)
Glucose: 130
Potassium: 3.6 (ref 3.4–5.3)
Sodium: 139 (ref 137–147)

## 2021-09-20 LAB — CBC
MCV: 90 (ref 81–99)
RBC: 4.5 (ref 3.87–5.11)

## 2021-09-20 LAB — CBC AND DIFFERENTIAL
HCT: 41 (ref 36–46)
Hemoglobin: 13.6 (ref 12.0–16.0)
Neutrophils Absolute: 2.8
Platelets: 252 (ref 150–399)
WBC: 5

## 2021-09-20 LAB — HEPATIC FUNCTION PANEL
ALT: 29 (ref 7–35)
AST: 29 (ref 13–35)
Alkaline Phosphatase: 53 (ref 25–125)
Bilirubin, Total: 0.4

## 2021-09-20 LAB — COMPREHENSIVE METABOLIC PANEL
Albumin: 4.4 (ref 3.5–5.0)
Calcium: 9.5 (ref 8.7–10.7)

## 2021-09-20 NOTE — Telephone Encounter (Signed)
Patient has been scheduled for follow-up visit per 09/20/21 los. Pt given an appt calendar with date and time.  Pt appt pending for Dexa scan and Mammogram.

## 2021-09-21 ENCOUNTER — Telehealth: Payer: Self-pay

## 2021-09-21 ENCOUNTER — Telehealth: Payer: Self-pay | Admitting: Oncology

## 2021-09-21 ENCOUNTER — Other Ambulatory Visit: Payer: Self-pay | Admitting: Oncology

## 2021-09-21 DIAGNOSIS — C187 Malignant neoplasm of sigmoid colon: Secondary | ICD-10-CM

## 2021-09-21 LAB — CEA: CEA: 5.6 ng/mL — ABNORMAL HIGH (ref 0.0–4.7)

## 2021-09-21 NOTE — Telephone Encounter (Signed)
Patient has been scheduled for Dexa on 10/05/21 @ 1:30 and Mammogram 09/23/22. Patient has been notified of appt information.   She is also aware of appt scheduled per 09/20/21 LOS.

## 2021-09-21 NOTE — Telephone Encounter (Signed)
-----   Message from Derwood Kaplan, MD sent at 09/21/2021  1:29 PM EST ----- Regarding: call Tell her CEA remains the same, 5.6, just slightly elevated.  This is possibly her normal.  To be extra cautious, I would recommend repeat in 6 months.  I will put in lab order but needs lab appt about 7/7

## 2021-10-02 ENCOUNTER — Other Ambulatory Visit: Payer: Self-pay | Admitting: Hematology and Oncology

## 2021-10-02 DIAGNOSIS — M810 Age-related osteoporosis without current pathological fracture: Secondary | ICD-10-CM

## 2021-10-02 DIAGNOSIS — M8580 Other specified disorders of bone density and structure, unspecified site: Secondary | ICD-10-CM

## 2021-10-09 NOTE — Telephone Encounter (Signed)
Patient notified

## 2021-10-09 NOTE — Telephone Encounter (Signed)
-----   Message from Derwood Kaplan, MD sent at 09/21/2021  1:29 PM EST ----- Regarding: call Tell her CEA remains the same, 5.6, just slightly elevated.  This is possibly her normal.  To be extra cautious, I would recommend repeat in 6 months.  I will put in lab order but needs lab appt about 7/7

## 2021-10-11 ENCOUNTER — Other Ambulatory Visit: Payer: Self-pay

## 2022-03-25 ENCOUNTER — Encounter: Payer: Self-pay | Admitting: Gastroenterology

## 2022-09-12 ENCOUNTER — Encounter: Payer: Self-pay | Admitting: Gastroenterology

## 2022-09-20 ENCOUNTER — Ambulatory Visit: Payer: Medicare HMO | Admitting: Hematology and Oncology

## 2022-09-20 ENCOUNTER — Other Ambulatory Visit: Payer: Medicare HMO

## 2022-09-26 ENCOUNTER — Inpatient Hospital Stay: Payer: Medicare HMO | Admitting: Oncology

## 2022-09-26 ENCOUNTER — Encounter: Payer: Self-pay | Admitting: Oncology

## 2022-09-26 ENCOUNTER — Inpatient Hospital Stay: Payer: Medicare HMO | Attending: Oncology

## 2022-09-26 ENCOUNTER — Other Ambulatory Visit: Payer: Self-pay | Admitting: Oncology

## 2022-09-26 VITALS — BP 143/70 | HR 77 | Temp 98.6°F | Resp 17 | Ht 64.0 in | Wt 184.5 lb

## 2022-09-26 DIAGNOSIS — M810 Age-related osteoporosis without current pathological fracture: Secondary | ICD-10-CM | POA: Insufficient documentation

## 2022-09-26 DIAGNOSIS — Z79899 Other long term (current) drug therapy: Secondary | ICD-10-CM | POA: Insufficient documentation

## 2022-09-26 DIAGNOSIS — Z85820 Personal history of malignant melanoma of skin: Secondary | ICD-10-CM | POA: Diagnosis present

## 2022-09-26 DIAGNOSIS — Z85038 Personal history of other malignant neoplasm of large intestine: Secondary | ICD-10-CM | POA: Diagnosis present

## 2022-09-26 DIAGNOSIS — C50911 Malignant neoplasm of unspecified site of right female breast: Secondary | ICD-10-CM

## 2022-09-26 DIAGNOSIS — Z853 Personal history of malignant neoplasm of breast: Secondary | ICD-10-CM | POA: Insufficient documentation

## 2022-09-26 DIAGNOSIS — C187 Malignant neoplasm of sigmoid colon: Secondary | ICD-10-CM | POA: Diagnosis not present

## 2022-09-26 DIAGNOSIS — Z170 Estrogen receptor positive status [ER+]: Secondary | ICD-10-CM | POA: Diagnosis not present

## 2022-09-26 DIAGNOSIS — M8589 Other specified disorders of bone density and structure, multiple sites: Secondary | ICD-10-CM

## 2022-09-26 DIAGNOSIS — R970 Elevated carcinoembryonic antigen [CEA]: Secondary | ICD-10-CM | POA: Insufficient documentation

## 2022-09-26 LAB — CBC WITH DIFFERENTIAL (CANCER CENTER ONLY)
Abs Immature Granulocytes: 0.03 10*3/uL (ref 0.00–0.07)
Basophils Absolute: 0 10*3/uL (ref 0.0–0.1)
Basophils Relative: 1 %
Eosinophils Absolute: 0.1 10*3/uL (ref 0.0–0.5)
Eosinophils Relative: 1 %
HCT: 38.7 % (ref 36.0–46.0)
Hemoglobin: 12.9 g/dL (ref 12.0–15.0)
Immature Granulocytes: 1 %
Lymphocytes Relative: 25 %
Lymphs Abs: 1.4 10*3/uL (ref 0.7–4.0)
MCH: 30.3 pg (ref 26.0–34.0)
MCHC: 33.3 g/dL (ref 30.0–36.0)
MCV: 90.8 fL (ref 80.0–100.0)
Monocytes Absolute: 0.5 10*3/uL (ref 0.1–1.0)
Monocytes Relative: 9 %
Neutro Abs: 3.6 10*3/uL (ref 1.7–7.7)
Neutrophils Relative %: 63 %
Platelet Count: 233 10*3/uL (ref 150–400)
RBC: 4.26 MIL/uL (ref 3.87–5.11)
RDW: 12.5 % (ref 11.5–15.5)
WBC Count: 5.6 10*3/uL (ref 4.0–10.5)
nRBC: 0 % (ref 0.0–0.2)

## 2022-09-26 LAB — CMP (CANCER CENTER ONLY)
ALT: 22 U/L (ref 0–44)
AST: 23 U/L (ref 15–41)
Albumin: 4.2 g/dL (ref 3.5–5.0)
Alkaline Phosphatase: 44 U/L (ref 38–126)
Anion gap: 9 (ref 5–15)
BUN: 16 mg/dL (ref 8–23)
CO2: 24 mmol/L (ref 22–32)
Calcium: 9.7 mg/dL (ref 8.9–10.3)
Chloride: 107 mmol/L (ref 98–111)
Creatinine: 0.82 mg/dL (ref 0.44–1.00)
GFR, Estimated: >60 mL/min (ref >60)
Glucose, Bld: 112 mg/dL — ABNORMAL HIGH (ref 70–99)
Potassium: 3.5 mmol/L (ref 3.5–5.1)
Sodium: 140 mmol/L (ref 135–145)
Total Bilirubin: 0.2 mg/dL — ABNORMAL LOW (ref 0.3–1.2)
Total Protein: 7.3 g/dL (ref 6.5–8.1)

## 2022-09-26 NOTE — Progress Notes (Signed)
[facility]  [address] [place],  [place]  [address] [phone]  Clinic Day:  09/26/22  Referring physician: Madison Hickman, FNP    CHIEF COMPLAINT:  CC: History of stage IA hormone receptor positive right breast cancer  Current Treatment:  Surveillance   HISTORY OF PRESENT ILLNESS:  Stephanie Leblanc is a 72 y.o. female with a history of stage IA (T1c N0 M0) hormone receptor positive right breast cancer diagnosed in November 2003. She was treated with lumpectomy.  Pathology revealed a 1.5 cm, grade 2, invasive carcinoma, in the background of extensive DCIS.  Sentinel node was negative.  Estrogen and progesterone receptors were positive and her 2 Neu positive.  At that time, Herceptin was not considered standard adjuvant therapy.  She received adjuvant chemotherapy with Adriamycin and Cytoxan for 4 cycles, followed by radiation to the right breast.  She then received adjuvant hormonal therapy consisting of tamoxifen for 2-1/2 years and letrozole for 2-1/2 years.  She also has osteopenia and took raloxifene for 5 years, which was discontinued in 2014.  She continues calcium and vitamin-D for her osteopenia.  ColoGuard testing was positive, so she underwent colonoscopy in June with Dr. Ardis Hughs.  Apparently, she had a polyp, which contained cancer, but was completely resected for a stage I.  She had repeat colonoscopy since that time, which did not reveal any evidence of recurrence or new polyps.  Review of her family history is negative except for 2 maternal uncles with lung cancer in their 79-70's.  She has a history of non melanoma skin cancer and sees her dermatologist yearly.  Mammogram from December 2020 was clear.  Bone density from December 2020 revealed osteoporosis of the AP spine with a T-score of -2.8.  Right femur showed a T-score of -2.0, and left femur showed a T-score of -1.9.  I recommend that we add in Fosamax 70 mg once weekly.  She notes  that she underwent colonoscopy with Dr. Ardis Hughs in August 2020, which was clear, and she will not need this repeated in 3 years.  She did have an elevated CEA of 4.9 in March 2021 at Dr. Ardis Hughs office.  This was repeated at the end of December and was stable. It did go up to 5.8 in July 2022, and was 5.6 in September. She will be due for repeat EGD and colonoscopy in August 2023.  INTERVAL HISTORY:  Stephanie Leblanc is here for annual follow up of her stage Ia breast cancer in November 2003, and states that she has been well..  Her mammogram was done on September 23, 2022 and is clear.  She had a bone density scan 1 year ago on October 05, 2021 and this showed a statistically significant increase in the bone mineral density of the AP spine and left hip.  Her T-scores had improved significantly and we will plan a repeat bone density in 1 year.  She continues Fosamax 70 mg once weekly.  Her labs are pending from today and I can call her with those results.  Her  appetite is good, and her weight is stable since her last visit.  She denies fever, chills or other signs of infection.  She denies nausea, vomiting, bowel issues, or abdominal pain.  She denies sore throat, cough, dyspnea, or chest pain.  REVIEW OF SYSTEMS:  Review of Systems  Constitutional: Negative.  Negative for appetite change, chills, fatigue, fever and unexpected weight change.  HENT:  Negative.  Eyes: Negative.   Respiratory: Negative.  Negative for chest tightness, cough, hemoptysis, shortness of breath and wheezing.   Cardiovascular: Negative.  Negative for chest pain, leg swelling and palpitations.  Gastrointestinal: Negative.  Negative for abdominal distention, abdominal pain, blood in stool, constipation, diarrhea, nausea and vomiting.  Endocrine: Negative.   Genitourinary: Negative.  Negative for difficulty urinating, dysuria, frequency and hematuria.   Musculoskeletal:  Positive for arthralgias and back pain. Negative for flank pain, gait  problem and myalgias.  Skin: Negative.   Neurological: Negative.  Negative for dizziness, extremity weakness, gait problem, headaches, light-headedness, numbness, seizures and speech difficulty.  Hematological: Negative.   Psychiatric/Behavioral: Negative.  Negative for depression and sleep disturbance. The patient is not nervous/anxious.      VITALS:  Blood pressure (!) 143/70, pulse 77, temperature 98.6 F (37 C), temperature source Oral, resp. rate 17, height 5' 4"$  (1.626 m), weight 184 lb 8 oz (83.7 kg), SpO2 98 %.  Wt Readings from Last 3 Encounters:  09/26/22 184 lb 8 oz (83.7 kg)  09/20/21 182 lb 14.4 oz (83 kg)  09/07/20 182 lb 9.6 oz (82.8 kg)    Body mass index is 31.67 kg/m.  Performance status (ECOG): 1 - Symptomatic but completely ambulatory  PHYSICAL EXAM:  Physical Exam Constitutional:      General: She is not in acute distress.    Appearance: Normal appearance. She is normal weight.  HENT:     Head: Normocephalic and atraumatic.  Eyes:     General: No scleral icterus.    Extraocular Movements: Extraocular movements intact.     Conjunctiva/sclera: Conjunctivae normal.     Pupils: Pupils are equal, round, and reactive to light.  Cardiovascular:     Rate and Rhythm: Normal rate and regular rhythm.     Pulses: Normal pulses.     Heart sounds: Normal heart sounds. No murmur heard.    No friction rub. No gallop.  Pulmonary:     Effort: Pulmonary effort is normal. No respiratory distress.     Breath sounds: Normal breath sounds.  Chest:     Comments: Scar at 1 o'clock in the right breast which is well healed and right axilla. No masses in either breast.  Abdominal:     General: Bowel sounds are normal. There is no distension.     Palpations: Abdomen is soft. There is no hepatomegaly, splenomegaly or mass.     Tenderness: There is no abdominal tenderness.     Comments: She has a well healed scar in the skin of the epigastric area.  Musculoskeletal:         General: Normal range of motion.     Cervical back: Normal range of motion and neck supple.     Right lower leg: No edema.     Left lower leg: No edema.  Lymphadenopathy:     Cervical: No cervical adenopathy.  Skin:    General: Skin is warm and dry.  Neurological:     General: No focal deficit present.     Mental Status: She is alert and oriented to person, place, and time. Mental status is at baseline.  Psychiatric:        Mood and Affect: Mood normal.        Behavior: Behavior normal.        Thought Content: Thought content normal.        Judgment: Judgment normal.     LABS:      Latest Ref Rng & Units  09/26/2022    3:43 PM 09/20/2021   12:00 AM 09/07/2020   12:00 AM  CBC  WBC 4.0 - 10.5 K/uL 5.6  5.0     4.7      Hemoglobin 12.0 - 15.0 g/dL 12.9  13.6     14.1      Hematocrit 36.0 - 46.0 % 38.7  41     42      Platelets 150 - 400 K/uL 233  252     203         This result is from an external source.      Latest Ref Rng & Units 09/26/2022    3:43 PM 09/20/2021   12:00 AM 09/07/2020   12:00 AM  CMP  Glucose 70 - 99 mg/dL 112     BUN 8 - 23 mg/dL 16  19     16      $ Creatinine 0.44 - 1.00 mg/dL 0.82  0.8     0.6      Sodium 135 - 145 mmol/L 140  139     138      Potassium 3.5 - 5.1 mmol/L 3.5  3.6     3.8      Chloride 98 - 111 mmol/L 107  106     108      CO2 22 - 32 mmol/L 24  23     21      $ Calcium 8.9 - 10.3 mg/dL 9.7  9.5     10.1      Total Protein 6.5 - 8.1 g/dL 7.3     Total Bilirubin 0.3 - 1.2 mg/dL 0.2     Alkaline Phos 38 - 126 U/L 44  53     52      AST 15 - 41 U/L 23  29     34      ALT 0 - 44 U/L 22  29     36         This result is from an external source.     STUDIES:  No results found.   EXAM:09/24/22 3D MAMMOGRAM SCREENING DIGITAL W CAD IMPRESSION:BENIGN There is no mammographic evidence of malignancy. Routine mammographic evaluation in 1 year is recommended.    EXAM:10/08/21 BONE DENSITY  BMI: 32.3 Sites measured: AP spine (L3-L4),  bilateral hips Densitometric Diagnosis: Low Bone Mass  Lowest Site Measured: Right femoral neck BMD: 0.653 T-score: -1.8  STATISTICALLY SIGNIFICANT INCREASE IN BMD OF THE AP SPINE AND LEFT HIP. RIGHT HIP IS STABLE  IMPRESSION: Based on the WHO criteria this patient has OSTEOPENIA (at multiple sites).    Allergies:  Allergies  Allergen Reactions   Prochlorperazine Other (See Comments)    Other reaction(s): Muscle Cramps   Compazine [Prochlorperazine Edisylate]     Muscle contractions    Current Medications: Current Outpatient Medications  Medication Sig Dispense Refill   alendronate (FOSAMAX) 70 MG tablet TAKE ONE TABLET once weekly 12 tablet 1   Calcium Carbonate-Vit D-Min (CALCIUM 1200 PO) Take 1,200 mg by mouth daily.      cholecalciferol (VITAMIN D) 1000 units tablet Take 1,000 Units by mouth daily.     ezetimibe (ZETIA) 10 MG tablet Take 10 mg by mouth. Three times a week not daily     pantoprazole (PROTONIX) 40 MG tablet Take 40 mg by mouth daily.     sertraline (ZOLOFT) 50 MG tablet Take 50 mg by mouth daily.     spironolactone (ALDACTONE) 25  MG tablet Take 25 mg by mouth daily.      valsartan (DIOVAN) 320 MG tablet Take 320 mg by mouth daily.      No current facility-administered medications for this visit.     ASSESSMENT & PLAN:   Assessment:   1. Remote history of stage IA hormone and her 2 Neu receptor positive breast cancer.  She is 21 years postop and remains without evidence of recurrence.  2. Stage I colon cancer.  She underwent colonoscopy in August 2020, as was advised not to have this repeated for 3 years.  3. History of melanoma of the trunk many years ago. She also has had multiple other non melanoma skin cancers throughout the years.  4. Osteoporosis, she is currently on calcium and vitamin-D, and well as Fosamax 70 mg once weekly.  Her latest bone density scan done in February 2023 shows significant improvement and she now has osteopenia.  5.   Mildly elevated CEA, fairly stable over a 3 year period.  This was 5.8 in 2022, 5.6 in 2023, and 5.7 now.  CT imaging from September 2022 was negative. We will continue to monitor this.   Plan: Annual mammogram is clear.  She knows to continue Fosamax 70 mg once weekly in addition to her oral calcium with vitamin D.  If all is well, we will plan to see her back in 1 year with CBC, CMP, CEA, bilateral mammogram and bone density scan.  She had negative CT scans in September 2022. The patient understands the plans discussed today and is in agreement with them.  She knows to contact our office if she develops concerns regarding her breast cancer.   I provided 15 minutes of face-to-face time during this this encounter and > 50% was spent counseling as documented under my assessment and plan.    Derwood Kaplan, MD [facility] [address] [place] [address] Dept: [phone] Dept Fax: [phone]    Ninetta Lights as a scribe for Derwood Kaplan, MD.,have documented all relevant documentation on the behalf of Derwood Kaplan, MD,as directed by  Derwood Kaplan, MD while in the presence of Derwood Kaplan, MD.

## 2022-09-28 LAB — CEA: CEA: 5.7 ng/mL — ABNORMAL HIGH (ref 0.0–4.7)

## 2022-10-08 ENCOUNTER — Telehealth: Payer: Self-pay

## 2022-10-08 NOTE — Telephone Encounter (Signed)
Patient notified. Labs will be faxed to PCP per request.

## 2022-10-08 NOTE — Telephone Encounter (Signed)
-----   Message from Derwood Kaplan, MD sent at 10/08/2022 12:36 PM EST ----- Regarding: call Tell her all her labs look good incl BS 112.  Her CEA (cancer test) still 1 point above normal but has been stable for the last 3 readings, 5.8, 5.6 and now 5.7.  I think this is her normal, send to her PCP

## 2022-10-11 ENCOUNTER — Other Ambulatory Visit: Payer: Self-pay | Admitting: Oncology

## 2022-10-11 DIAGNOSIS — C187 Malignant neoplasm of sigmoid colon: Secondary | ICD-10-CM

## 2022-10-23 ENCOUNTER — Encounter: Payer: Self-pay | Admitting: Oncology

## 2022-11-06 ENCOUNTER — Encounter: Payer: Self-pay | Admitting: Gastroenterology

## 2022-11-06 ENCOUNTER — Ambulatory Visit (AMBULATORY_SURGERY_CENTER): Payer: Medicare HMO

## 2022-11-06 VITALS — Ht 64.0 in | Wt 182.0 lb

## 2022-11-06 DIAGNOSIS — Z85038 Personal history of other malignant neoplasm of large intestine: Secondary | ICD-10-CM

## 2022-11-06 MED ORDER — NA SULFATE-K SULFATE-MG SULF 17.5-3.13-1.6 GM/177ML PO SOLN: 1.0000 | Freq: Once | ORAL | 0 refills | Status: AC

## 2022-11-06 NOTE — Progress Notes (Signed)
Pre visit completed via phone call; Patient verified name, DOB, and address;  No egg or soy allergy known to patient;  No issues known to pt with past sedation with any surgeries or procedures; Patient denies ever being told they had issues or difficulty with intubation;  No FH of Malignant Hyperthermia; Pt is not on diet pills; Pt is not on home 02;  Pt is not on blood thinners;  Pt denies issues with constipation;  No A fib or A flutter; Have any cardiac testing pending--NO Pt instructed to use Singlecare.com or GoodRx for a price reduction on prep;   Insurance verified during [facility] appt=[employer]  Patient's chart reviewed by Osvaldo Angst CNRA prior to previsit and patient appropriate for the [facility].  Previsit completed and red dot placed by patient's name on their procedure day (on provider's schedule);

## 2022-11-26 ENCOUNTER — Encounter: Payer: Self-pay | Admitting: Gastroenterology

## 2022-11-26 ENCOUNTER — Ambulatory Visit (AMBULATORY_SURGERY_CENTER): Payer: Medicare HMO | Admitting: Gastroenterology

## 2022-11-26 VITALS — BP 151/67 | HR 74 | Temp 98.2°F | Resp 17 | Ht 64.0 in | Wt 182.0 lb

## 2022-11-26 DIAGNOSIS — K635 Polyp of colon: Secondary | ICD-10-CM | POA: Diagnosis not present

## 2022-11-26 DIAGNOSIS — Z85038 Personal history of other malignant neoplasm of large intestine: Secondary | ICD-10-CM

## 2022-11-26 DIAGNOSIS — D128 Benign neoplasm of rectum: Secondary | ICD-10-CM

## 2022-11-26 DIAGNOSIS — D123 Benign neoplasm of transverse colon: Secondary | ICD-10-CM | POA: Diagnosis not present

## 2022-11-26 DIAGNOSIS — Z08 Encounter for follow-up examination after completed treatment for malignant neoplasm: Secondary | ICD-10-CM

## 2022-11-26 DIAGNOSIS — Z86010 Personal history of colonic polyps: Secondary | ICD-10-CM

## 2022-11-26 MED ORDER — SODIUM CHLORIDE 0.9 % IV SOLN: 500.0000 mL | Freq: Once | INTRAVENOUS | Status: DC

## 2022-11-26 NOTE — Progress Notes (Signed)
Called to room to assist during endoscopic procedure.  Patient ID and intended procedure confirmed with present staff. Received instructions for my participation in the procedure from the performing physician.  

## 2022-11-26 NOTE — Progress Notes (Signed)
VS completed by DT.  Pt's states no medical or surgical changes since previsit or office visit.  

## 2022-11-26 NOTE — Op Note (Signed)
[facility] Patient Name: Stephanie Leblanc Procedure Date: 11/26/2022 7:56 AM MRN: [id number] Endoscopist: Justice Britain , MD, [id number] Age: 72 Referring MD:  Date of Birth: 1951/02/26 Gender: Female Account #: [id number] Procedure:                Colonoscopy Indications:              High risk colon cancer surveillance: Personal                            history of colonic polyps, High risk colon cancer                            surveillance: Personal history of colon cancer Medicines:                Monitored Anesthesia Care Procedure:                Pre-Anesthesia Assessment:                           - Prior to the procedure, a History and Physical                            was performed, and patient medications and                            allergies were reviewed. The patient's tolerance of                            previous anesthesia was also reviewed. The risks                            and benefits of the procedure and the sedation                            options and risks were discussed with the patient.                            All questions were answered, and informed consent                            was obtained. Prior Anticoagulants: The patient has                            taken no anticoagulant or antiplatelet agents                            except for aspirin. ASA Grade Assessment: II - A                            patient with mild systemic disease. After reviewing                            the risks and benefits, the patient was deemed in  satisfactory condition to undergo the procedure.                           After obtaining informed consent, the colonoscope                            was passed under direct vision. Throughout the                            procedure, the patient's blood pressure, pulse, and                            oxygen saturations were monitored continuously. The                             Olympus CF-HQ190L [id number]) Colonoscope was                            introduced through the anus and advanced to the the                            cecum, identified by appendiceal orifice and                            ileocecal valve. The ileocecal valve, appendiceal                            orifice, and rectum were photographed. Scope In: 8:03:31 AM Scope Out: 8:18:34 AM Scope Withdrawal Time: 0 hours 11 minutes 38 seconds  Total Procedure Duration: 0 hours 15 minutes 3 seconds  Findings:                 The digital rectal exam findings include                            hemorrhoids. Pertinent negatives include no                            palpable rectal lesions.                           Four sessile polyps were found in the rectum (3)                            and hepatic flexure (1). The polyps were 2 to 3 mm                            in size. These polyps were removed with a cold                            snare. Resection and retrieval were complete.                           Multiple small-mouthed diverticula were found in  the sigmoid colon and descending colon.                           Normal mucosa was found in the entire colon                            otherwise.                           Non-bleeding non-thrombosed external and internal                            hemorrhoids were found during retroflexion, during                            perianal exam and during digital exam. The                            hemorrhoids were Grade II (internal hemorrhoids                            that prolapse but reduce spontaneously). Complications:            No immediate complications. Estimated Blood Loss:     Estimated blood loss was minimal. Impression:               - Hemorrhoids found on digital rectal exam.                           - Four 2 to 3 mm polyps in the rectum and at the                            hepatic flexure, removed  with a cold snare.                            Resected and retrieved.                           - Diverticulosis in the sigmoid colon and in the                            descending colon.                           - Normal mucosa in the entire examined colon                            otherwise.                           - Non-bleeding non-thrombosed external and internal                            hemorrhoids.                           - The GI Genius (  intelligent endoscopy module),                            computer-aided polyp detection system powered by AI                            was utilized to detect colorectal polyps through                            enhanced visualization during colonoscopy. Recommendation:           - The patient will be observed post-procedure,                            until all discharge criteria are met.                           - Discharge patient to home.                           - Patient has a contact number available for                            emergencies. The signs and symptoms of potential                            delayed complications were discussed with the                            patient. Return to normal activities tomorrow.                            Written discharge instructions were provided to the                            patient.                           - High fiber diet.                           - Use FiberCon 1-2 tablets PO daily.                           - Continue present medications.                           - Await pathology results.                           - Repeat colonoscopy in 3 - 5 years for                            surveillance due to personal history of adenomatous                            polyps  and colon cancer.                           - The findings and recommendations were discussed                            with the patient.                           - The findings and recommendations  were discussed                            with the patient's family. Justice Britain, MD 11/26/2022 8:27:25 AM

## 2022-11-26 NOTE — Patient Instructions (Signed)
Discharge instructions given. Handouts on polyps,diverticulosis and Hemorrhoids. Use FiberCon 1-2 tablets by mouth daily. Resume previous medications. YOU HAD AN ENDOSCOPIC PROCEDURE TODAY AT [facility]:   Refer to the procedure report that was given to you for any specific questions about what was found during the examination.  If the procedure report does not answer your questions, please call your gastroenterologist to clarify.  If you requested that your care partner not be given the details of your procedure findings, then the procedure report has been included in a sealed envelope for you to review at your convenience later.  YOU SHOULD EXPECT: Some feelings of bloating in the abdomen. Passage of more gas than usual.  Walking can help get rid of the air that was put into your GI tract during the procedure and reduce the bloating. If you had a lower endoscopy (such as a colonoscopy or flexible sigmoidoscopy) you may notice spotting of blood in your stool or on the toilet paper. If you underwent a bowel prep for your procedure, you may not have a normal bowel movement for a few days.  Please Note:  You might notice some irritation and congestion in your nose or some drainage.  This is from the oxygen used during your procedure.  There is no need for concern and it should clear up in a day or so.  SYMPTOMS TO REPORT IMMEDIATELY:  Following lower endoscopy (colonoscopy or flexible sigmoidoscopy):  Excessive amounts of blood in the stool  Significant tenderness or worsening of abdominal pains  Swelling of the abdomen that is new, acute  Fever of 100F or higher   For urgent or emergent issues, a gastroenterologist can be reached at any hour by calling [phone]. Do not use MyChart messaging for urgent concerns.    DIET:  We do recommend a small meal at first, but then you may proceed to your regular diet.  Drink plenty of fluids but you should avoid alcoholic beverages  for 24 hours.  ACTIVITY:  You should plan to take it easy for the rest of today and you should NOT DRIVE or use heavy machinery until tomorrow (because of the sedation medicines used during the test).    FOLLOW UP: Our staff will call the number listed on your records the next business day following your procedure.  We will call around 7:15- 8:00 am to check on you and address any questions or concerns that you may have regarding the information given to you following your procedure. If we do not reach you, we will leave a message.     If any biopsies were taken you will be contacted by phone or by letter within the next 1-3 weeks.  Please call us at [phone] if you have not heard about the biopsies in 3 weeks.    SIGNATURES/CONFIDENTIALITY: You and/or your care partner have signed paperwork which will be entered into your electronic medical record.  These signatures attest to the fact that that the information above on your After Visit Summary has been reviewed and is understood.  Full responsibility of the confidentiality of this discharge information lies with you and/or your care-partner.

## 2022-11-26 NOTE — Progress Notes (Signed)
Report to PACU, RN, vss, BBS= Clear.  

## 2022-11-26 NOTE — Progress Notes (Signed)
GASTROENTEROLOGY PROCEDURE H&P NOTE   Primary Care Physician: Madison Hickman, FNP  HPI: Stephanie Leblanc is a 72 y.o. female who presents for Colonoscopy for surveillance in setting of prior colon cancer and colon polyps.  Past Medical History:  Diagnosis Date   Anxiety    Arthritis    knee left-on meds   Breast cancer ([place]) 2003   RIGHT side   Cataract    not a surgical candidate at this time 11/06/2022   Colon cancer ([place]) 2019   sigmoid   Elevated cholesterol 2019   on meds   GERD (gastroesophageal reflux disease)    on meds   Hypertension 2003   on meds   Kidney stones    2 stones in the past   Osteoporosis    borderline -on meds   Past Surgical History:  Procedure Laterality Date   ABDOMINAL HYSTERECTOMY     BREAST LUMPECTOMY Right    right breast   CESAREAN SECTION     x2   COLONOSCOPY  05/07/2019   DJ-MAC-goly(good)-normal-3 yr recall   [place] N/A 03/12/2018   Procedure: FLEXIBLE SIGMOIDOSCOPY;  Surgeon: Milus Banister, MD;  Location: [facility];  Service: Endoscopy;  Laterality: N/A;   KNEE ARTHROSCOPY Left    left knee   POLYPECTOMY     TONSILLECTOMY AND ADENOIDECTOMY     Current Outpatient Medications  Medication Sig Dispense Refill   alendronate (FOSAMAX) 70 MG tablet TAKE ONE TABLET once weekly 12 tablet 1   Calcium Carbonate-Vit D-Min (CALCIUM 1200 PO) Take 1,200 mg by mouth daily.      cholecalciferol (VITAMIN D) 1000 units tablet Take 1,000 Units by mouth daily.     ezetimibe (ZETIA) 10 MG tablet Take 10 mg by mouth 3 (three) times a week.     pantoprazole (PROTONIX) 40 MG tablet Take 40 mg by mouth daily.     sertraline (ZOLOFT) 50 MG tablet Take 50 mg by mouth daily.     spironolactone (ALDACTONE) 25 MG tablet Take 25 mg by mouth daily.      valsartan (DIOVAN) 320 MG tablet Take 320 mg by mouth daily.      ibuprofen (ADVIL) 800 MG tablet Take 800 mg by mouth every 8 (eight) hours as needed for moderate pain (as  needed).     Current Facility-Administered Medications  Medication Dose Route Frequency Provider Last Rate Last Admin   0.9 %  sodium chloride infusion  500 mL Intravenous Once Mansouraty, Telford Nab., MD        Current Outpatient Medications:    alendronate (FOSAMAX) 70 MG tablet, TAKE ONE TABLET once weekly, Disp: 12 tablet, Rfl: 1   Calcium Carbonate-Vit D-Min (CALCIUM 1200 PO), Take 1,200 mg by mouth daily. , Disp: , Rfl:    cholecalciferol (VITAMIN D) 1000 units tablet, Take 1,000 Units by mouth daily., Disp: , Rfl:    ezetimibe (ZETIA) 10 MG tablet, Take 10 mg by mouth 3 (three) times a week., Disp: , Rfl:    pantoprazole (PROTONIX) 40 MG tablet, Take 40 mg by mouth daily., Disp: , Rfl:    sertraline (ZOLOFT) 50 MG tablet, Take 50 mg by mouth daily., Disp: , Rfl:    spironolactone (ALDACTONE) 25 MG tablet, Take 25 mg by mouth daily. , Disp: , Rfl:    valsartan (DIOVAN) 320 MG tablet, Take 320 mg by mouth daily. , Disp: , Rfl:    ibuprofen (ADVIL) 800 MG tablet, Take 800 mg by mouth every  8 (eight) hours as needed for moderate pain (as needed)., Disp: , Rfl:   Current Facility-Administered Medications:    0.9 %  sodium chloride infusion, 500 mL, Intravenous, Once, Mansouraty, Telford Nab., MD Allergies  Allergen Reactions   Prochlorperazine Other (See Comments)    Other reaction(s): Muscle Cramps   Compazine [Prochlorperazine Edisylate]     Muscle contractions   Family History  Problem Relation Age of Onset   Heart disease Mother    Diabetes Mother    Colon polyps Mother 23   Heart disease Father    Esophageal cancer Neg Hx    Rectal cancer Neg Hx    Stomach cancer Neg Hx    Social History   Socioeconomic History   Marital status: Married    Spouse name: Not on file   Number of children: Not on file   Years of education: Not on file   Highest education level: Not on file  Occupational History   Not on file  Tobacco Use   Smoking status: Never   Smokeless tobacco:  Never  Vaping Use   Vaping Use: Never used  Substance and Sexual Activity   Alcohol use: Never   Drug use: Never   Sexual activity: Not on file  Other Topics Concern   Not on file  Social History Narrative   Not on file   Social Determinants of Health   Financial Resource Strain: Not on file  Food Insecurity: Not on file  Transportation Needs: Not on file  Physical Activity: Not on file  Stress: Not on file  Social Connections: Not on file  Intimate Partner Violence: Not on file    Physical Exam: Today's Vitals   11/26/22 0724  BP: 137/70  Pulse: 81  Temp: 98.2 F (36.8 C)  TempSrc: Temporal  SpO2: 95%  Weight: 182 lb (82.6 kg)  Height: 5\' 4"  (1.626 m)   Body mass index is 31.24 kg/m. GEN: NAD EYE: Sclerae anicteric ENT: MMM CV: Non-tachycardic GI: Soft, NT/ND NEURO:  Alert & Oriented x 3  Lab Results: No results for input(s): "WBC", "HGB", "HCT", "PLT" in the last 72 hours. BMET No results for input(s): "NA", "K", "CL", "CO2", "GLUCOSE", "BUN", "CREATININE", "CALCIUM" in the last 72 hours. LFT No results for input(s): "PROT", "ALBUMIN", "AST", "ALT", "ALKPHOS", "BILITOT", "BILIDIR", "IBILI" in the last 72 hours. PT/INR No results for input(s): "LABPROT", "INR" in the last 72 hours.   Impression / Plan: This is a 72 y.o.female who presents for Colonoscopy for surveillance in setting of prior colon cancer and colon polyps.   The risks and benefits of endoscopic evaluation/treatment were discussed with the patient and/or family; these include but are not limited to the risk of perforation, infection, bleeding, missed lesions, lack of diagnosis, severe illness requiring hospitalization, as well as anesthesia and sedation related illnesses.  The patient's history has been reviewed, patient examined, no change in status, and deemed stable for procedure.  The patient and/or family is agreeable to proceed.    Justice Britain, MD [facility] # [phone]

## 2022-11-27 ENCOUNTER — Telehealth: Payer: Self-pay | Admitting: *Deleted

## 2022-11-27 NOTE — Telephone Encounter (Signed)
  Follow up Call-     11/26/2022    7:24 AM  Call back number  Post procedure Call Back phone  # [phone]  Permission to leave phone message Yes     Patient questions:  Do you have a fever, pain , or abdominal swelling? No. Pain Score  0 *  Have you tolerated food without any problems? Yes.    Have you been able to return to your normal activities? Yes.    Do you have any questions about your discharge instructions: Diet   No. Medications  No. Follow up visit  No.  Do you have questions or concerns about your Care? No.  Actions: * If pain score is 4 or above: No action needed, pain <4.

## 2022-11-28 ENCOUNTER — Encounter: Payer: Self-pay | Admitting: Gastroenterology

## 2023-09-25 LAB — HM MAMMOGRAPHY

## 2023-10-09 LAB — HM DEXA SCAN

## 2023-10-16 ENCOUNTER — Encounter: Payer: Self-pay | Admitting: Oncology

## 2023-10-16 ENCOUNTER — Inpatient Hospital Stay: Payer: Medicare HMO | Attending: Oncology | Admitting: Oncology

## 2023-10-16 ENCOUNTER — Other Ambulatory Visit: Payer: Self-pay | Admitting: Oncology

## 2023-10-16 ENCOUNTER — Telehealth: Payer: Self-pay

## 2023-10-16 ENCOUNTER — Telehealth: Payer: Self-pay | Admitting: Oncology

## 2023-10-16 ENCOUNTER — Other Ambulatory Visit: Payer: Self-pay

## 2023-10-16 ENCOUNTER — Inpatient Hospital Stay: Payer: Medicare HMO

## 2023-10-16 VITALS — BP 155/68 | HR 86 | Temp 98.1°F | Resp 16 | Ht 64.0 in | Wt 192.8 lb

## 2023-10-16 DIAGNOSIS — Z85038 Personal history of other malignant neoplasm of large intestine: Secondary | ICD-10-CM | POA: Diagnosis present

## 2023-10-16 DIAGNOSIS — C187 Malignant neoplasm of sigmoid colon: Secondary | ICD-10-CM | POA: Diagnosis not present

## 2023-10-16 DIAGNOSIS — Z853 Personal history of malignant neoplasm of breast: Secondary | ICD-10-CM | POA: Diagnosis present

## 2023-10-16 DIAGNOSIS — R970 Elevated carcinoembryonic antigen [CEA]: Secondary | ICD-10-CM | POA: Diagnosis not present

## 2023-10-16 DIAGNOSIS — Z79899 Other long term (current) drug therapy: Secondary | ICD-10-CM | POA: Diagnosis not present

## 2023-10-16 DIAGNOSIS — Z85820 Personal history of malignant melanoma of skin: Secondary | ICD-10-CM | POA: Diagnosis not present

## 2023-10-16 DIAGNOSIS — C50911 Malignant neoplasm of unspecified site of right female breast: Secondary | ICD-10-CM

## 2023-10-16 DIAGNOSIS — Z170 Estrogen receptor positive status [ER+]: Secondary | ICD-10-CM | POA: Diagnosis not present

## 2023-10-16 DIAGNOSIS — M8589 Other specified disorders of bone density and structure, multiple sites: Secondary | ICD-10-CM | POA: Diagnosis not present

## 2023-10-16 LAB — CBC WITH DIFFERENTIAL (CANCER CENTER ONLY)
Abs Immature Granulocytes: 0.03 10*3/uL (ref 0.00–0.07)
Basophils Absolute: 0 10*3/uL (ref 0.0–0.1)
Basophils Relative: 1 %
Eosinophils Absolute: 0.1 10*3/uL (ref 0.0–0.5)
Eosinophils Relative: 2 %
HCT: 39.1 % (ref 36.0–46.0)
Hemoglobin: 13.5 g/dL (ref 12.0–15.0)
Immature Granulocytes: 1 %
Lymphocytes Relative: 21 %
Lymphs Abs: 1.2 10*3/uL (ref 0.7–4.0)
MCH: 30 pg (ref 26.0–34.0)
MCHC: 34.5 g/dL (ref 30.0–36.0)
MCV: 86.9 fL (ref 80.0–100.0)
Monocytes Absolute: 0.4 10*3/uL (ref 0.1–1.0)
Monocytes Relative: 8 %
Neutro Abs: 3.9 10*3/uL (ref 1.7–7.7)
Neutrophils Relative %: 67 %
Platelet Count: 224 10*3/uL (ref 150–400)
RBC: 4.5 MIL/uL (ref 3.87–5.11)
RDW: 13.4 % (ref 11.5–15.5)
WBC Count: 5.7 10*3/uL (ref 4.0–10.5)
nRBC: 0 % (ref 0.0–0.2)
nRBC: 0 /100{WBCs}

## 2023-10-16 LAB — CMP (CANCER CENTER ONLY)
ALT: 23 U/L (ref 0–44)
AST: 27 U/L (ref 15–41)
Albumin: 4.3 g/dL (ref 3.5–5.0)
Alkaline Phosphatase: 59 U/L (ref 38–126)
Anion gap: 13 (ref 5–15)
BUN: 18 mg/dL (ref 8–23)
CO2: 22 mmol/L (ref 22–32)
Calcium: 10.2 mg/dL (ref 8.9–10.3)
Chloride: 105 mmol/L (ref 98–111)
Creatinine: 0.81 mg/dL (ref 0.44–1.00)
GFR, Estimated: >60 mL/min (ref >60)
Glucose, Bld: 155 mg/dL — ABNORMAL HIGH (ref 70–99)
Potassium: 4 mmol/L (ref 3.5–5.1)
Sodium: 140 mmol/L (ref 135–145)
Total Bilirubin: 0.3 mg/dL (ref 0.0–1.2)
Total Protein: 7.1 g/dL (ref 6.5–8.1)

## 2023-10-16 LAB — CEA (ACCESS): CEA (CHCC): 7.01 ng/mL — ABNORMAL HIGH (ref 0.00–5.00)

## 2023-10-16 NOTE — Telephone Encounter (Signed)
Referral sent electronically through  [facility].

## 2023-10-16 NOTE — Telephone Encounter (Signed)
-----   Message from Dellia Beckwith sent at 10/16/2023 10:41 AM EST ----- Regarding: refer Refer Dr. Dulce Sellar for increasing DOE and breast arterial calcifications, has seen a long time ago

## 2023-10-16 NOTE — Telephone Encounter (Signed)
Patient has been scheduled for follow-up visit per 10/16/23 LOS.  Pt given an appt calendar with date and time.

## 2023-10-16 NOTE — Progress Notes (Addendum)
[facility]  [address] [place],  [place]  [address] [phone]  Clinic Day:  10/16/23  Referring physician: Jerrye Bushy, FNP    CHIEF COMPLAINT:  CC: History of stage IA hormone receptor positive right breast cancer  Current Treatment:  Surveillance  HISTORY OF PRESENT ILLNESS:  Stephanie Leblanc is a 73 y.o. female with a history of stage IA (T1c N0 M0) hormone receptor positive right breast cancer diagnosed in November 2003. She was treated with lumpectomy.  Pathology revealed a 1.5 cm, grade 2, invasive carcinoma, in the background of extensive DCIS.  Sentinel node was negative.  Estrogen and progesterone receptors were positive and her 2 Neu positive.  At that time, Herceptin was not considered standard adjuvant therapy.  She received adjuvant chemotherapy with Adriamycin and Cytoxan for 4 cycles, followed by radiation to the right breast.  She then received adjuvant hormonal therapy consisting of tamoxifen for 2-1/2 years and letrozole for 2-1/2 years.  She also has osteopenia and took raloxifene for 5 years, which was discontinued in 2014.  She continues calcium and vitamin-D for her osteopenia.  ColoGuard testing was positive, so she underwent colonoscopy in June with Dr. Christella Hartigan.  Apparently, she had a polyp, which contained cancer, but was completely resected for a stage I.  She had repeat colonoscopy since that time, which did not reveal any evidence of recurrence or new polyps.  Review of her family history is negative except for 2 maternal uncles with lung cancer in their 31-70's.  She has a history of non melanoma skin cancer and sees her dermatologist yearly.  Mammogram from December 2020 was clear.  Bone density from December 2020 revealed osteoporosis of the AP spine with a T-score of -2.8.  Right femur showed a T-score of -2.0, and left femur showed a T-score of -1.9.  I recommend that we add in Fosamax 70 mg once weekly.  She notes that she underwent  colonoscopy with Dr. Christella Hartigan in August 2020, which was clear, and she will not need this repeated in 3 years.  She did have an elevated CEA of 4.9 in March 2021 at Dr. Christella Hartigan office.  This was repeated at the end of December and was stable. It did go up to 5.8 in July 2022, and was 5.6 in September. She will be due for repeat EGD and colonoscopy in August 2023.  INTERVAL HISTORY:  Stephanie Leblanc is here for annual follow up of her history of stage IA hormone receptor positive right breast cancer in November 2003. Patient states that she feels well but complains of right sciatica pain. She has discussed this with her PCP and is undergoing physical therapy. Her and her PCP are also considering injections if this worsens. She also complains of worsening shortness of breath with mild exertion. She also has a history of colon cancer and had her last colonoscopy in March of 2024. She did have polyps and a repeat was recommended in 3-5 years. She had a screening mammogram done on 09/25/2023 that was clear and had a breast arterial calcification evaluation done on the same day. This revealed breast arterial calcification was present in one breast and was recommended follow-up for further evaluation. I will send this copy to her PCP to further discuss these findings. I will also refer her to cardiologist Dr. Dulce Sellar for his opinion as I am not familiar with this test.  DEXA scan done on 10/09/2023 revealed a stable spine with a T-score of -1.50 and  improved right femur with a T-score of -1.00 previously -1.20. She continues Fosamax 70 mg once weekly.She has a WBC of 5.7, hemoglobin of 13.5, and platelet count of 224,000. Her CMP is completely normal and her CEA is pending. I will see her back in 1 year with screening bilateral mammogram.   She denies signs of infection such as sore throat, sinus drainage, cough, or urinary symptoms.  She denies fevers or recurrent chills.  She denies nausea, vomiting, chest pain, dyspnea,  orthopnea, PND, or cough. Her appetite is good and her weight has increased 10 pounds over last 11 months .   REVIEW OF SYSTEMS:  Review of Systems  Constitutional: Negative.  Negative for appetite change, chills, diaphoresis, fatigue, fever and unexpected weight change.  HENT:  Negative.  Negative for hearing loss, lump/mass, mouth sores, nosebleeds, sore throat, tinnitus, trouble swallowing and voice change.   Eyes: Negative.   Respiratory:  Positive for shortness of breath (with exertion). Negative for chest tightness, cough, hemoptysis and wheezing.   Cardiovascular: Negative.  Negative for chest pain, leg swelling and palpitations.  Gastrointestinal: Negative.  Negative for abdominal distention, abdominal pain, blood in stool, constipation, diarrhea, nausea, rectal pain and vomiting.  Endocrine: Negative.   Genitourinary: Negative.  Negative for bladder incontinence, difficulty urinating, dyspareunia, dysuria, frequency, hematuria, menstrual problem, nocturia, pelvic pain, vaginal bleeding and vaginal discharge.   Musculoskeletal:  Positive for arthralgias and back pain. Negative for flank pain, gait problem, myalgias, neck pain and neck stiffness.       Sciatica pain  Skin: Negative.  Negative for itching, rash and wound.  Neurological: Negative.  Negative for dizziness, extremity weakness, gait problem, headaches, light-headedness, numbness, seizures and speech difficulty.  Hematological: Negative.  Negative for adenopathy. Does not bruise/bleed easily.  Psychiatric/Behavioral: Negative.  Negative for confusion, decreased concentration, depression, sleep disturbance and suicidal ideas. The patient is not nervous/anxious.      VITALS:  Blood pressure (!) 155/68, pulse 86, temperature 98.1 F (36.7 C), temperature source Oral, resp. rate 16, height 5\' 4"  (1.626 m), weight 192 lb 12.8 oz (87.5 kg), SpO2 97%.  Wt Readings from Last 3 Encounters:  10/16/23 192 lb 12.8 oz (87.5 kg)   11/26/22 182 lb (82.6 kg)  11/06/22 182 lb (82.6 kg)    Body mass index is 33.09 kg/m.  Performance status (ECOG): 1 - Symptomatic but completely ambulatory  PHYSICAL EXAM:  Physical Exam Constitutional:      General: She is not in acute distress.    Appearance: Normal appearance. She is normal weight. She is not ill-appearing, toxic-appearing or diaphoretic.  HENT:     Head: Normocephalic and atraumatic.     Right Ear: Tympanic membrane, ear canal and external ear normal.     Left Ear: Tympanic membrane, ear canal and external ear normal.  Eyes:     General: No scleral icterus.    Extraocular Movements: Extraocular movements intact.     Conjunctiva/sclera: Conjunctivae normal.     Pupils: Pupils are equal, round, and reactive to light.  Cardiovascular:     Rate and Rhythm: Normal rate and regular rhythm.     Pulses: Normal pulses.     Heart sounds: Normal heart sounds. No murmur heard.    No friction rub. No gallop.  Pulmonary:     Effort: Pulmonary effort is normal. No respiratory distress.     Breath sounds: Normal breath sounds.  Chest:     Comments: Right breast has a faded scar in the  upper inner quadrant which is well healed and a barely visible axillary scar.  No masses in either breast.  Abdominal:     General: Bowel sounds are normal. There is no distension.     Palpations: Abdomen is soft. There is no hepatomegaly, splenomegaly or mass.     Tenderness: There is no abdominal tenderness.     Comments: Upper abdominal scar with some keloid formation.   Musculoskeletal:        General: Normal range of motion.     Cervical back: Normal range of motion and neck supple.     Right lower leg: No edema.     Left lower leg: No edema.  Lymphadenopathy:     Cervical: No cervical adenopathy.  Skin:    General: Skin is warm and dry.  Neurological:     General: No focal deficit present.     Mental Status: She is alert and oriented to person, place, and time. Mental  status is at baseline.  Psychiatric:        Mood and Affect: Mood normal.        Behavior: Behavior normal.        Thought Content: Thought content normal.        Judgment: Judgment normal.     LABS:      Latest Ref Rng & Units 10/16/2023    9:55 AM 09/26/2022    3:43 PM 09/20/2021   12:00 AM  CBC  WBC 4.0 - 10.5 K/uL 5.7  5.6  5.0      Hemoglobin 12.0 - 15.0 g/dL 96.2  95.2  84.1      Hematocrit 36.0 - 46.0 % 39.1  38.7  41      Platelets 150 - 400 K/uL 224  233  252         This result is from an external source.      Latest Ref Rng & Units 10/16/2023    9:55 AM 09/26/2022    3:43 PM 09/20/2021   12:00 AM  CMP  Glucose 70 - 99 mg/dL 324  401    BUN 8 - 23 mg/dL 18  16  19       Creatinine 0.44 - 1.00 mg/dL 0.27  2.53  0.8      Sodium 135 - 145 mmol/L 140  140  139      Potassium 3.5 - 5.1 mmol/L 4.0  3.5  3.6      Chloride 98 - 111 mmol/L 105  107  106      CO2 22 - 32 mmol/L 22  24  23       Calcium 8.9 - 10.3 mg/dL 66.4  9.7  9.5      Total Protein 6.5 - 8.1 g/dL 7.1  7.3    Total Bilirubin 0.0 - 1.2 mg/dL 0.3  0.2    Alkaline Phos 38 - 126 U/L 59  44  53      AST 15 - 41 U/L 27  23  29       ALT 0 - 44 U/L 23  22  29          This result is from an external source.   Lab Results  Component Value Date   CEA 7.01 (H) 10/16/2023     STUDIES:  Exam: 09/25/2023 3D Screening Mammogram with CAD Impression: No mammographic evidence of malignancy.   Exam: 09/25/2023 Breast Arterial Calcification (BAC) Impression: Present unilaterally, identified in one breast and recommended follow-up for  further evaluation.   Exam: 10/09/2023 Bone Density Scan Impression: Right femur with a T-score of -1.80. Left femur with a T-score of -2.00. AP L3-L4 with a T-score of -1.50 which has been stable. Right total femur with a T-score of -1.00 previously -1.20.     Allergies:  Allergies  Allergen Reactions   Prochlorperazine Other (See Comments)    Other reaction(s): Muscle  Cramps   Compazine [Prochlorperazine Edisylate]     Muscle contractions    Current Medications: Current Outpatient Medications  Medication Sig Dispense Refill   alendronate (FOSAMAX) 70 MG tablet TAKE ONE TABLET once weekly 12 tablet 1   Calcium Carbonate-Vit D-Min (CALCIUM 1200 PO) Take 1,200 mg by mouth daily.      cholecalciferol (VITAMIN D) 1000 units tablet Take 1,000 Units by mouth daily.     ezetimibe (ZETIA) 10 MG tablet Take 10 mg by mouth 3 (three) times a week.     ibuprofen (ADVIL) 800 MG tablet Take 800 mg by mouth every 8 (eight) hours as needed for moderate pain (as needed).     pantoprazole (PROTONIX) 40 MG tablet Take 40 mg by mouth daily.     sertraline (ZOLOFT) 50 MG tablet Take 50 mg by mouth daily.     spironolactone (ALDACTONE) 25 MG tablet Take 25 mg by mouth daily.      valsartan (DIOVAN) 320 MG tablet Take 320 mg by mouth daily.      No current facility-administered medications for this visit.   ASSESSMENT & PLAN:  Assessment:   1. Remote history of stage IA hormone and her 2 Neu receptor positive breast cancer.  She is 22 years postop and remains without evidence of recurrence.  2. Stage I colon cancer.  She underwent colonoscopy in August, 2020 and March, 2024 as was advised not to have this repeated in 3-5 years.  3. History of melanoma of the trunk many years ago. She also has had multiple other non melanoma skin cancers throughout the years. She will be seeing the dermatologist regarding some lesions of her left face.   4. Osteoporosis, she is currently on calcium and vitamin-D, and well as Fosamax 70 mg once weekly.  Her latest bone density scan done in February 2023 shows significant improvement and she now has osteopenia.  5.  Mildly elevated CEA, fairly stable over a 3 year period.  This was 5.8 in 2022, 5.6 in 2023, and 5.7 in 2024.  CT imaging from September, 2022 was negative. We will continue to monitor this.  Plan: She had a screening mammogram  done on 09/25/2023 that was clear and had a breast arterial calcification evaluation done on the same day. This revealed breast arterial calcification was present in one breast and was recommended follow-up for further evaluation. I will send this copy to her PCP to further discuss these findings. I will also refer her to cardiologist Dr. Dulce Sellar for his opinion, as I am not familiar with this test. She also has a history of colon cancer and had her last colonoscopy in March of 2024. She did have polyps and a repeat was recommended in 3-5 years. DEXA scan done on 10/09/2023 revealed a stable spine with a T-score of -1.50 and improved right femur with a T-score of -1.00 previously -1.20. She continues Fosamax 70 mg once weekly.She has a WBC of 5.7, hemoglobin of 13.5, and platelet count of 224,000. Her CMP is completely normal and her CEA is pending. I will see her back in 1  year with screening bilateral mammogram. The patient understands the plans discussed today and is in agreement with them.  She knows to contact our office if she develops concerns regarding her breast cancer.  I provided 15 minutes of face-to-face time during this this encounter and > 50% was spent counseling as documented under my assessment and plan.   Dellia Beckwith, MD  [facility] OF [facility] [address] [place] [address] Dept: [phone] Dept Fax: [phone]   Orders Placed This Encounter  Procedures   Ambulatory referral to Cardiology    Referral Priority:   Routine    Referral Type:   Consultation    Referral Reason:   Specialty Services Required    Referred to Provider:   Baldo Daub, MD    Number of Visits Requested:   1   I,Jasmine M Lassiter,acting as a scribe for Dellia Beckwith, MD.,have documented all relevant documentation on the behalf of Dellia Beckwith, MD,as directed by  Dellia Beckwith, MD while in the  presence of Dellia Beckwith, MD.

## 2023-10-22 ENCOUNTER — Other Ambulatory Visit: Payer: Self-pay | Admitting: Oncology

## 2023-10-22 DIAGNOSIS — R970 Elevated carcinoembryonic antigen [CEA]: Secondary | ICD-10-CM

## 2023-10-22 HISTORY — DX: Elevated carcinoembryonic antigen (CEA): R97.0

## 2023-10-22 NOTE — Progress Notes (Signed)
 Cardiology Office Note:    Date:  10/27/2023   ID:  Stephanie Leblanc, DOB Jul 26, 1951, MRN [id number]  PCP:  Jerrye Bushy, FNP  Cardiologist:  Norman Herrlich, MD   Referring MD: Dellia Beckwith, MD  ASSESSMENT:    1. Arterial calcification   2. SOB (shortness of breath) on exertion   3. Thoracic aorta atherosclerosis (HCC)   4. Abdominal aortic atherosclerosis (HCC)   5. Essential hypertension   6. Mixed hyperlipidemia    PLAN:    In order of problems listed above:  Another marker of atherosclerosis she is on lipid-lowering therapy continue Zetia she is due for labs next week with her PCP and if her LDL remains greater than target 100 or even greater than 70 would benefit from combined therapy with bempedoic acid and make a combination with Zetia and she could take it 3 days a week. At risk for cardiomyopathy late after chemotherapy echocardiogram was ordered Hypertension very well-controlled continue her combination therapy including ARB and spironolactone  Next appointment as needed, if abnormal echocardiogram will bring her back to my office   Medication Adjustments/Labs and Tests Ordered: Current medicines are reviewed at length with the patient today.  Concerns regarding medicines are outlined above.  Orders Placed This Encounter  Procedures   EKG 12-Lead   No orders of the defined types were placed in this encounter.    Chief Complaint  Patient presents with   Shortness of Breath  I had an abnormal mammogram with breast artery calcification  History of Present Illness:    Stephanie Leblanc is a 73 y.o. female with a history of stage Ia hormone receptor positive right breast cancer who is being seen today for the evaluation of gross vascular calcification and shortness of breath at the request of Dellia Beckwith, MD. Reportedly her chart actually comments that there is no calcification in the breast on the mammogram 09/24/2023. Interim in April  2017 with a history of hypertension in the context of breast cancer lumpectomy chemotherapy and radiation therapy and chart review also says she has fibromuscular dysplasia of the renal artery not noted on CTA of the abdomen and pelvis in 2021.CT chest abdomen pelvis September 2022 showed thoracic abdominal aortic atherosclerosis.  Her lipid profile November 2024 showed an LDL of 121 non-HDL cholesterol quite elevated 153 total 200.  Well-known to me had seen her decades ago when she had very difficult and resistant hypertension with a degree of fibromuscular dysplasia and ever since spironolactone was added to her medications her blood pressure is easily controlled She had aortic atherosclerosis intolerant of statins and has been maintained on Zetia She had chemotherapy and notices that she is short of breath she is at risk for cardiomyopathy an echocardiogram is ordered and if abnormal bring her back to the office She is not having edema orthopnea chest pain palpitation or syncope I explained to her that breast calcification is another marker of increased cardiovascular risk and the responses lipid-lowering treatment She is due for labs next week and her LDL remains elevated would benefit from combined therapy with bempedoic acid then make a combination with Zetia and she could take it 3 days a week. Past Medical History:  Diagnosis Date   Abnormal CT of the chest 04/23/2018   Anxiety    Arthritis    knee left-on meds   Breast cancer (HCC) 2003   RIGHT side   Cataract    not a surgical candidate at this time  11/06/2022   Colon cancer (HCC) 2019   sigmoid   Dyspnea 04/19/2019   Elevated CEA 10/22/2023   Elevated cholesterol 2019   on meds   GERD (gastroesophageal reflux disease)    on meds   HTN (hypertension) 01/19/2018   Hx of breast cancer 01/19/2018   2003     Hypertension 2003   on meds     Kidney stones    2 stones in the past   Low back pain 05/29/2021   Lymphedema of  right upper extremity 09/11/2020   Malignant melanoma (HCC) 09/02/1998   Malignant neoplasm of sigmoid colon (HCC)    Malignant tumor of breast (HCC) 09/02/2001   Osteopenia 12/13/2019   Osteoporosis    borderline -on meds   Spondylolisthesis of lumbar region 08/21/2021    Past Surgical History:  Procedure Laterality Date   ABDOMINAL HYSTERECTOMY     BREAST LUMPECTOMY Right    right breast   CESAREAN SECTION     x2   COLONOSCOPY  05/07/2019   DJ-MAC-goly(good)-normal-3 yr recall   FLEXIBLE SIGMOIDOSCOPY N/A 03/12/2018   Procedure: FLEXIBLE SIGMOIDOSCOPY;  Surgeon: Rachael Fee, MD;  Location: [facility];  Service: Endoscopy;  Laterality: N/A;   KNEE ARTHROSCOPY Left    left knee   POLYPECTOMY     TONSILLECTOMY AND ADENOIDECTOMY      Current Medications: Current Meds  Medication Sig   alendronate (FOSAMAX) 70 MG tablet TAKE ONE TABLET once weekly   Calcium Carbonate-Vit D-Min (CALCIUM 1200 PO) Take 1,200 mg by mouth daily.    cholecalciferol (VITAMIN D) 1000 units tablet Take 1,000 Units by mouth daily.   ezetimibe (ZETIA) 10 MG tablet Take 10 mg by mouth 3 (three) times a week.   ibuprofen (ADVIL) 800 MG tablet Take 800 mg by mouth every 8 (eight) hours as needed for moderate pain (as needed).   pantoprazole (PROTONIX) 40 MG tablet Take 40 mg by mouth daily.   sertraline (ZOLOFT) 50 MG tablet Take 50 mg by mouth daily.   spironolactone (ALDACTONE) 25 MG tablet Take 25 mg by mouth daily.    valsartan (DIOVAN) 320 MG tablet Take 320 mg by mouth daily.      Allergies:   Prochlorperazine and Compazine [prochlorperazine edisylate]   Social History   Socioeconomic History   Marital status: Married    Spouse name: Not on file   Number of children: Not on file   Years of education: Not on file   Highest education level: Not on file  Occupational History   Not on file  Tobacco Use   Smoking status: Never   Smokeless tobacco: Never  Vaping Use   Vaping status:  Never Used  Substance and Sexual Activity   Alcohol use: Never   Drug use: Never   Sexual activity: Not on file  Other Topics Concern   Not on file  Social History Narrative   Not on file   Social Drivers of Health   Financial Resource Strain: Not on file  Food Insecurity: Not on file  Transportation Needs: Not on file  Physical Activity: Not on file  Stress: Not on file  Social Connections: Not on file     Family History: The patient's family history includes Colon polyps (age of onset: 77) in her mother; Diabetes in her mother; Heart disease in her father and mother. There is no history of Esophageal cancer, Rectal cancer, or Stomach cancer.  ROS:   ROS Please see the history of present  illness.     All other systems reviewed and are negative.  EKGs/Labs/Other Studies Reviewed:    The following studies were reviewed today: EKG Interpretation Date/Time:  Monday October 27 2023 13:45:54 EST Ventricular Rate:  97 PR Interval:  148 QRS Duration:  80 QT Interval:  344 QTC Calculation: 436 R Axis:   -5  Text Interpretation: Normal sinus rhythm Minimal voltage criteria for LVH, may be normal variant ECG OTHERWISE WITHIN NORMAL LIMITS When compared with ECG of 04-Aug-2002 13:22, No significant change was found Confirmed by Norman Herrlich ([id number]) on 10/27/2023 1:52:34 PM    Recent Labs: 10/16/2023: ALT 23; BUN 18; Creatinine 0.81; Hemoglobin 13.5; Platelet Count 224; Potassium 4.0; Sodium 140  Recent Lipid Panel No results found for: "CHOL", "TRIG", "HDL", "CHOLHDL", "VLDL", "LDLCALC", "LDLDIRECT"  Physical Exam:    VS:  BP 130/60 (BP Location: Left Arm, Patient Position: Sitting, Cuff Size: Normal)   Pulse 97   Ht 5\' 4"  (1.626 m)   Wt 189 lb (85.7 kg)   SpO2 94%   BMI 32.44 kg/m     Wt Readings from Last 3 Encounters:  10/27/23 189 lb (85.7 kg)  10/16/23 192 lb 12.8 oz (87.5 kg)  11/26/22 182 lb (82.6 kg)     GEN:  Well nourished, well developed in no acute  distress HEENT: Normal NECK: No JVD; No carotid bruits LYMPHATICS: No lymphadenopathy CARDIAC: RRR, no murmurs, rubs, gallops RESPIRATORY:  Clear to auscultation without rales, wheezing or rhonchi  ABDOMEN: Soft, non-tender, non-distended MUSCULOSKELETAL:  No edema; No deformity  SKIN: Warm and dry NEUROLOGIC:  Alert and oriented x 3 PSYCHIATRIC:  Normal affect     Signed, Norman Herrlich, MD  10/27/2023 2:06 PM    [facility]

## 2023-10-23 DIAGNOSIS — H269 Unspecified cataract: Secondary | ICD-10-CM | POA: Insufficient documentation

## 2023-10-23 DIAGNOSIS — F419 Anxiety disorder, unspecified: Secondary | ICD-10-CM | POA: Insufficient documentation

## 2023-10-23 DIAGNOSIS — N200 Calculus of kidney: Secondary | ICD-10-CM | POA: Insufficient documentation

## 2023-10-23 DIAGNOSIS — M1990 Unspecified osteoarthritis, unspecified site: Secondary | ICD-10-CM | POA: Insufficient documentation

## 2023-10-23 DIAGNOSIS — M810 Age-related osteoporosis without current pathological fracture: Secondary | ICD-10-CM | POA: Insufficient documentation

## 2023-10-24 ENCOUNTER — Telehealth: Payer: Self-pay | Admitting: Oncology

## 2023-10-24 NOTE — Telephone Encounter (Signed)
Contacted pt to schedule an appt. Unable to reach via phone, voicemail was left.   call Received: 2 days ago Dellia Beckwith, MD sent to Jeannette Corpus, LPN; Thomasena Edis; [place], [place] Tell her CEA went up to 7, just a little over 1 point higher but is a change, so I rec CT scans of chest/abd/pelvis. I will order and can see her after done to discuss results

## 2023-10-27 ENCOUNTER — Encounter: Payer: Self-pay | Admitting: Cardiology

## 2023-10-27 ENCOUNTER — Telehealth: Payer: Self-pay

## 2023-10-27 ENCOUNTER — Ambulatory Visit: Payer: Medicare HMO | Attending: Cardiology | Admitting: Cardiology

## 2023-10-27 VITALS — BP 130/60 | HR 97 | Ht 64.0 in | Wt 189.0 lb

## 2023-10-27 DIAGNOSIS — I700 Atherosclerosis of aorta: Secondary | ICD-10-CM | POA: Diagnosis not present

## 2023-10-27 DIAGNOSIS — R0602 Shortness of breath: Secondary | ICD-10-CM

## 2023-10-27 DIAGNOSIS — I10 Essential (primary) hypertension: Secondary | ICD-10-CM | POA: Diagnosis not present

## 2023-10-27 DIAGNOSIS — I7090 Unspecified atherosclerosis: Secondary | ICD-10-CM

## 2023-10-27 DIAGNOSIS — E782 Mixed hyperlipidemia: Secondary | ICD-10-CM

## 2023-10-27 NOTE — Telephone Encounter (Signed)
-----   Message from Dellia Beckwith sent at 10/22/2023  7:23 AM EST ----- Regarding: call Tell her CEA went up to 7, just a little over 1 point higher but is a change, so I rec CT scans of chest/abd/pelvis. I will order and can see her after done to discuss results

## 2023-10-27 NOTE — Patient Instructions (Signed)
 Medication Instructions:  Your physician recommends that you continue on your current medications as directed. Please refer to the Current Medication list given to you today.  *If you need a refill on your cardiac medications before your next appointment, please call your pharmacy*   Lab Work: None If you have labs (blood work) drawn today and your tests are completely normal, you will receive your results only by: MyChart Message (if you have MyChart) OR A paper copy in the mail If you have any lab test that is abnormal or we need to change your treatment, we will call you to review the results.   Testing/Procedures: Your physician has requested that you have an echocardiogram. Echocardiography is a painless test that uses sound waves to create images of your heart. It provides your doctor with information about the size and shape of your heart and how well your heart's chambers and valves are working. This procedure takes approximately one hour. There are no restrictions for this procedure. Please do NOT wear cologne, perfume, aftershave, or lotions (deodorant is allowed). Please arrive 15 minutes prior to your appointment time.  Please note: We ask at that you not bring children with you during ultrasound (echo/ vascular) testing. Due to room size and safety concerns, children are not allowed in the ultrasound rooms during exams. Our front office staff cannot provide observation of children in our lobby area while testing is being conducted. An adult accompanying a patient to their appointment will only be allowed in the ultrasound room at the discretion of the ultrasound technician under special circumstances. We apologize for any inconvenience.    Follow-Up: At [facility], you and your health needs are our priority.  As part of our continuing mission to provide you with exceptional heart care, we have created designated Provider Care Teams.  These Care Teams include your  primary Cardiologist (physician) and Advanced Practice Providers (APPs -  Physician Assistants and Nurse Practitioners) who all work together to provide you with the care you need, when you need it.  We recommend signing up for the patient portal called "MyChart".  Sign up information is provided on this After Visit Summary.  MyChart is used to connect with patients for Virtual Visits (Telemedicine).  Patients are able to view lab/test results, encounter notes, upcoming appointments, etc.  Non-urgent messages can be sent to your provider as well.   To learn more about what you can do with MyChart, go to [email or web address].    Your next appointment:   Follow up to be determined after testing  Provider:   Norman Herrlich, MD    Other Instructions None

## 2023-10-27 NOTE — Telephone Encounter (Signed)
 Unable to reach patient via phone to let her know about her lab results. Will try back later.

## 2023-10-29 NOTE — Telephone Encounter (Signed)
 Patient has been scheduled. Aware of appt date and time.

## 2023-11-06 ENCOUNTER — Ambulatory Visit (HOSPITAL_BASED_OUTPATIENT_CLINIC_OR_DEPARTMENT_OTHER)
Admission: RE | Admit: 2023-11-06 | Discharge: 2023-11-06 | Disposition: A | Discharge status: Home / Self Care | Payer: Medicare HMO | Source: Ambulatory Visit | Attending: Oncology | Admitting: Oncology

## 2023-11-06 DIAGNOSIS — R970 Elevated carcinoembryonic antigen [CEA]: Secondary | ICD-10-CM

## 2023-11-06 MED ORDER — IOHEXOL 300 MG/ML  SOLN
100.0000 mL | Freq: Once | INTRAMUSCULAR | Status: AC | PRN
  Administered 2023-11-06: 100 mL via INTRAVENOUS

## 2023-11-10 ENCOUNTER — Other Ambulatory Visit: Payer: Self-pay | Admitting: Oncology

## 2023-11-10 DIAGNOSIS — C187 Malignant neoplasm of sigmoid colon: Secondary | ICD-10-CM

## 2023-11-11 ENCOUNTER — Inpatient Hospital Stay: Payer: Medicare HMO | Admitting: Oncology

## 2023-11-11 ENCOUNTER — Telehealth: Payer: Self-pay

## 2023-11-11 DIAGNOSIS — I890 Lymphedema, not elsewhere classified: Secondary | ICD-10-CM

## 2023-11-11 DIAGNOSIS — C187 Malignant neoplasm of sigmoid colon: Secondary | ICD-10-CM

## 2023-11-11 DIAGNOSIS — C50911 Malignant neoplasm of unspecified site of right female breast: Secondary | ICD-10-CM

## 2023-11-11 NOTE — Telephone Encounter (Signed)
 Patient notified of message and sent to scheduling to cancel appt and schedule next appointments in 6 months

## 2023-11-11 NOTE — Telephone Encounter (Signed)
-----   Message from Dellia Beckwith sent at 11/10/2023  6:03 PM EDT ----- Regarding: call Tell her scan is all clear, we can cancel Tues appt. And see her back in 6 months with repeat CEA (do 2 days prior to appt)  But glad to see her if she wants to keep appt.

## 2023-11-14 ENCOUNTER — Ambulatory Visit: Payer: Medicare HMO | Attending: Cardiology

## 2023-11-14 DIAGNOSIS — E782 Mixed hyperlipidemia: Secondary | ICD-10-CM | POA: Diagnosis not present

## 2023-11-14 DIAGNOSIS — I700 Atherosclerosis of aorta: Secondary | ICD-10-CM | POA: Diagnosis not present

## 2023-11-14 DIAGNOSIS — I10 Essential (primary) hypertension: Secondary | ICD-10-CM | POA: Diagnosis not present

## 2023-11-14 DIAGNOSIS — R0602 Shortness of breath: Secondary | ICD-10-CM

## 2023-11-14 DIAGNOSIS — I7090 Unspecified atherosclerosis: Secondary | ICD-10-CM

## 2023-11-14 LAB — ECHOCARDIOGRAM COMPLETE
AR max vel: 2.08 cm2
AV Area VTI: 2.03 cm2
AV Area mean vel: 2.25 cm2
AV Mean grad: 4.6 mmHg
AV Peak grad: 10.4 mmHg
Ao pk vel: 1.61 m/s
Area-P 1/2: 4.63 cm2
MV M vel: 3.69 m/s
MV Peak grad: 54.5 mmHg
S' Lateral: 3 cm

## 2023-11-18 DIAGNOSIS — R0602 Shortness of breath: Secondary | ICD-10-CM

## 2024-01-14 ENCOUNTER — Encounter: Payer: Self-pay | Admitting: Emergency Medicine

## 2024-01-14 ENCOUNTER — Ambulatory Visit: Admitting: Emergency Medicine

## 2024-01-14 VITALS — BP 118/56 | HR 93 | Ht 64.0 in | Wt 186.4 lb

## 2024-01-14 DIAGNOSIS — R0602 Shortness of breath: Secondary | ICD-10-CM

## 2024-01-14 DIAGNOSIS — R9389 Abnormal findings on diagnostic imaging of other specified body structures: Secondary | ICD-10-CM | POA: Diagnosis not present

## 2024-01-14 NOTE — Patient Instructions (Signed)
 We will plan for pulmonary function testing to review at your next office visit. We will range for repeat CT scan of your chest to be done in September 2025. Please follow Dr. Baldwin Levee in about 2 months so that we can review your pulmonary function testing and your breathing status.  Please call if you have any new respiratory issues.

## 2024-01-14 NOTE — Assessment & Plan Note (Signed)
 Suspect there may be a component of deconditioning here.  Her echocardiogram was reassuring with the exception of some possible PAH which may merit further investigation.  No history to support sleep apnea.  Consider chronic thromboembolic disease.  She needs pulmonary function testing to evaluate for possible underlying pulmonary/airways component to her shortness of breath.  We will arrange for this.

## 2024-01-14 NOTE — Progress Notes (Signed)
 Subjective:    Patient ID: Stephanie Leblanc, female    DOB: 1951-05-19, 73 y.o.   MRN: [id number]  HPI 73 year old woman, never smoker, who is reestablishing care.  She has a history of breast cancer, melanoma, colon cancer.  I seen her before for an abnormal CT scan of the chest with small pulmonary nodules and some pleural thickening.  She is referred back today by Dr. Sandee Crook with cardiology to evaluate shortness of breath and also her chest imaging.  She underwent a CT scan of the chest abdomen and pelvis 11/06/2023 given her history of colon cancer, elevated CEA She has been having some increased exertional SOB for about 1 year. Happens w fast walking, hills,. She can walk comfortably through store. She still does chores at home. Works 3 days a week [employer]. No CP, or tightness. She has sciatica. She hears some UA noise occasionally. She has some allergy, occasional cough.  20 lbs +   Echocardiogram 11/14/2023 LVEF 60-65% with normal LV function and normal diastolic function.  RV size and function were normal.  There was some moderately elevated pulmonary pressures with an estimated RVSP 50 mmHg  CT scan of the chest, abdomen, pelvis 11/06/2023 reviewed by me, shows no mediastinal or hilar adenopathy, elevated right hemidiaphragm, normal left hemidiaphragm, some evidence for bibasilar pleural scarring/nodularity stable back to CT chest 2019.  There is some dependent pleural-based opacity in the posterior inferior right upper lobe that appears to be new but with benign characteristics.  No overt evidence for metastasis or recurrence.  No pleural disease.  No overt infiltrate   Review of Systems As per HPI  Past Medical History:  Diagnosis Date   Abnormal CT of the chest 04/23/2018   Anxiety    Arthritis    knee left-on meds   Breast cancer (HCC) 2003   RIGHT side   Cataract    not a surgical candidate at this time 11/06/2022   Colon cancer (HCC) 2019   sigmoid   Dyspnea  04/19/2019   Elevated CEA 10/22/2023   Elevated cholesterol 2019   on meds   GERD (gastroesophageal reflux disease)    on meds   HTN (hypertension) 01/19/2018   Hx of breast cancer 01/19/2018   2003     Hypertension 2003   on meds     Kidney stones    2 stones in the past   Low back pain 05/29/2021   Lymphedema of right upper extremity 09/11/2020   Malignant melanoma (HCC) 09/02/1998   Malignant neoplasm of sigmoid colon (HCC)    Malignant tumor of breast (HCC) 09/02/2001   Osteopenia 12/13/2019   Osteoporosis    borderline -on meds   Spondylolisthesis of lumbar region 08/21/2021     Family History  Problem Relation Age of Onset   Heart disease Mother    Diabetes Mother    Colon polyps Mother 64   Heart disease Father    Esophageal cancer Neg Hx    Rectal cancer Neg Hx    Stomach cancer Neg Hx      Social History   Socioeconomic History   Marital status: Married    Spouse name: Not on file   Number of children: Not on file   Years of education: Not on file   Highest education level: Not on file  Occupational History   Not on file  Tobacco Use   Smoking status: Never   Smokeless tobacco: Never  Vaping Use   Vaping  status: Never Used  Substance and Sexual Activity   Alcohol use: Never   Drug use: Never   Sexual activity: Not on file  Other Topics Concern   Not on file  Social History Narrative   Not on file   Social Drivers of Health   Financial Resource Strain: Not on file  Food Insecurity: Not on file  Transportation Needs: Not on file  Physical Activity: Not on file  Stress: Not on file  Social Connections: Not on file  Intimate Partner Violence: Not on file     Allergies  Allergen Reactions   Prochlorperazine Other (See Comments)    Other reaction(s): Muscle Cramps   Compazine [Prochlorperazine Edisylate]     Muscle contractions     Outpatient Medications Prior to Visit  Medication Sig Dispense Refill   alendronate (FOSAMAX) 70 MG  tablet TAKE ONE TABLET once weekly 12 tablet 1   Calcium Carbonate-Vit D-Min (CALCIUM 1200 PO) Take 1,200 mg by mouth daily.      cholecalciferol (VITAMIN D) 1000 units tablet Take 1,000 Units by mouth daily.     ezetimibe (ZETIA) 10 MG tablet Take 10 mg by mouth 3 (three) times a week.     ibuprofen (ADVIL) 800 MG tablet Take 800 mg by mouth every 8 (eight) hours as needed for moderate pain (as needed).     pantoprazole (PROTONIX) 40 MG tablet Take 40 mg by mouth daily.     sertraline (ZOLOFT) 50 MG tablet Take 50 mg by mouth daily.     spironolactone (ALDACTONE) 25 MG tablet Take 25 mg by mouth daily.      valsartan (DIOVAN) 320 MG tablet Take 320 mg by mouth daily.      No facility-administered medications prior to visit.         Objective:   Physical Exam Vitals:   01/14/24 0921  BP: (!) 118/56  Pulse: 93  SpO2: 95%  Weight: 186 lb 6.4 oz (84.6 kg)  Height: 5\' 4"  (1.626 m)   Gen: Pleasant, well-nourished, in no distress,  normal affect  ENT: No lesions,  mouth clear,  oropharynx clear, no postnasal drip  Neck: No JVD, no stridor  Lungs: No use of accessory muscles, no crackles or wheezing on normal respiration, no wheeze on forced expiration  Cardiovascular: RRR, heart sounds normal, no murmur or gallops, no peripheral edema  Musculoskeletal: No deformities, no cyanosis or clubbing  Neuro: alert, awake, non focal  Skin: Warm, no lesions or rash      Assessment & Plan:   Dyspnea Suspect there may be a component of deconditioning here.  Her echocardiogram was reassuring with the exception of some possible PAH which may merit further investigation.  No history to support sleep apnea.  Consider chronic thromboembolic disease.  She needs pulmonary function testing to evaluate for possible underlying pulmonary/airways component to her shortness of breath.  We will arrange for this.  Abnormal CT of the chest Some pleural-based irregularity in the bilateral lower lobes  which is chronic.  There is a new area of irregularity posterior aspect of the right upper lobe of unclear importance.  Given her history of malignancy I think we should follow with serial imaging.  Next CT scan to be done at the 7-month mark.     Racheal Buddle, MD, PhD 01/14/2024, 12:56 PM [facility] [phone] or if no answer before 7:00PM call [phone] For any issues after 7:00PM please call eLink [phone]

## 2024-01-14 NOTE — Assessment & Plan Note (Signed)
 Some pleural-based irregularity in the bilateral lower lobes which is chronic.  There is a new area of irregularity posterior aspect of the right upper lobe of unclear importance.  Given her history of malignancy I think we should follow with serial imaging.  Next CT scan to be done at the 51-month mark.

## 2024-01-28 ENCOUNTER — Ambulatory Visit: Admitting: Emergency Medicine

## 2024-01-28 DIAGNOSIS — R0602 Shortness of breath: Secondary | ICD-10-CM | POA: Diagnosis not present

## 2024-01-28 LAB — PULMONARY FUNCTION TEST
DL/VA % pred: 99 %
DL/VA: 4.1 ml/min/mmHg/L
DLCO cor % pred: 77 %
DLCO cor: 15.01 ml/min/mmHg
DLCO unc % pred: 77 %
DLCO unc: 15.01 ml/min/mmHg
FEF 25-75 Post: 2.45 L/s
FEF 25-75 Pre: 2.55 L/s
FEF2575-%Change-Post: -4 %
FEF2575-%Pred-Post: 139 %
FEF2575-%Pred-Pre: 145 %
FEV1-%Change-Post: -1 %
FEV1-%Pred-Post: 93 %
FEV1-%Pred-Pre: 95 %
FEV1-Post: 2.03 L
FEV1-Pre: 2.07 L
FEV1FVC-%Change-Post: 3 %
FEV1FVC-%Pred-Pre: 111 %
FEV6-%Change-Post: -5 %
FEV6-%Pred-Post: 84 %
FEV6-%Pred-Pre: 89 %
FEV6-Post: 2.34 L
FEV6-Pre: 2.46 L
FEV6FVC-%Change-Post: 0 %
FEV6FVC-%Pred-Post: 104 %
FEV6FVC-%Pred-Pre: 104 %
FVC-%Change-Post: -5 %
FVC-%Pred-Post: 80 %
FVC-%Pred-Pre: 85 %
FVC-Post: 2.34 L
FVC-Pre: 2.46 L
Post FEV1/FVC ratio: 87 %
Post FEV6/FVC ratio: 100 %
Pre FEV1/FVC ratio: 84 %
Pre FEV6/FVC Ratio: 100 %
RV % pred: 74 %
RV: 1.69 L
TLC % pred: 78 %
TLC: 3.97 L

## 2024-01-28 NOTE — Patient Instructions (Signed)
 Full PFT performed today.

## 2024-01-28 NOTE — Progress Notes (Signed)
 Full PFT performed today.

## 2024-04-27 ENCOUNTER — Telehealth: Payer: Self-pay

## 2024-04-27 NOTE — Telephone Encounter (Signed)
 Called and spoke with the pt regarding scheduling appt to review PFT & CT scan.  Pt has been schedule with Candis PA at [facility] 8/27 to review PFT & RB 10/9 to review CT scan. Pt is aware and verbalized understanding. Nothing further is needed.

## 2024-04-28 ENCOUNTER — Ambulatory Visit (HOSPITAL_BASED_OUTPATIENT_CLINIC_OR_DEPARTMENT_OTHER)

## 2024-04-28 ENCOUNTER — Encounter (HOSPITAL_BASED_OUTPATIENT_CLINIC_OR_DEPARTMENT_OTHER): Payer: Self-pay

## 2024-04-28 ENCOUNTER — Ambulatory Visit: Admitting: Emergency Medicine

## 2024-04-28 VITALS — BP 145/72 | HR 88 | Ht 64.0 in | Wt 181.6 lb

## 2024-04-28 DIAGNOSIS — R9389 Abnormal findings on diagnostic imaging of other specified body structures: Secondary | ICD-10-CM | POA: Diagnosis not present

## 2024-04-28 DIAGNOSIS — R0602 Shortness of breath: Secondary | ICD-10-CM | POA: Diagnosis not present

## 2024-04-28 NOTE — Progress Notes (Signed)
 @Patient  ID: Stephanie Leblanc, female    DOB: 03-08-1951, 73 y.o.   MRN: [id number]  Chief Complaint  Patient presents with   Follow-up    PFT results    Referring provider: Benson Eleanor Leblanc*  HPI: Stephanie Leblanc. Stephanie Leblanc is a 73 year old female never smoker with past medical history of breast cancer, melanoma, colon cancer who was last seen in office by Dr. Shelah in May 2025 for evaluation of shortness of breath.  She has also had some abnormal chest imaging; chest CT is currently scheduled for follow-up on 05/17/2024.  She did complete pulmonary function testing to evaluate her shortness of breath.  These results were reviewed with her today which indicate a mild restrictive component.  Patient reports that since her last visit she and her husband have downsized to a smaller home.  She also feels that her shortness of breath is improved.  She felt that she was mostly limited by back pain in the past.  She has since had improvement in her back pain and treatment of her sciatica which she feels has improved her ambulatory ability.  She currently denies chest pain, cough, fever, chills, night sweats, unintentional weight loss.  She does report that she was recently diagnosed with type 2 diabetes and was started on a GLP-1 medication.  She has had some weight loss with this.  Today we also reviewed her prior CT in preparation for her follow-up CT.  This did reveal elevation of the right hemidiaphragm which may also provide a restrictive component.  TEST/EVENTS :   Allergies  Allergen Reactions   Prochlorperazine Other (See Comments)    Other reaction(s): Muscle Cramps   Compazine [Prochlorperazine Edisylate]     Muscle contractions    Immunization History  Administered Date(s) Administered   Influenza, Quadrivalent, Recombinant, Inj, Pf 07/09/2018   PFIZER(Purple Top)SARS-COV-2 Vaccination 09/25/2019, 10/16/2019    Past Medical History:  Diagnosis Date   Abnormal CT of the chest  04/23/2018   Anxiety    Arthritis    knee left-on meds   Breast cancer (HCC) 2003   RIGHT side   Cataract    not a surgical candidate at this time 11/06/2022   Colon cancer (HCC) 2019   sigmoid   Dyspnea 04/19/2019   Elevated CEA 10/22/2023   Elevated cholesterol 2019   on meds   GERD (gastroesophageal reflux disease)    on meds   HTN (hypertension) 01/19/2018   Hx of breast cancer 01/19/2018   2003     Hypertension 2003   on meds     Kidney stones    2 stones in the past   Low back pain 05/29/2021   Lymphedema of right upper extremity 09/11/2020   Malignant melanoma (HCC) 09/02/1998   Malignant neoplasm of sigmoid colon (HCC)    Malignant tumor of breast (HCC) 09/02/2001   Osteopenia 12/13/2019   Osteoporosis    borderline -on meds   Spondylolisthesis of lumbar region 08/21/2021    Tobacco History: Social History   Tobacco Use  Smoking Status Never  Smokeless Tobacco Never   Counseling given: Not Answered   Outpatient Medications Prior to Visit  Medication Sig Dispense Refill   alendronate (FOSAMAX) 70 MG tablet TAKE ONE TABLET once weekly 12 tablet 1   Calcium Carbonate-Vit D-Min (CALCIUM 1200 PO) Take 1,200 mg by mouth daily.      cholecalciferol (VITAMIN D) 1000 units tablet Take 1,000 Units by mouth daily.     ezetimibe (ZETIA) 10  MG tablet Take 10 mg by mouth 3 (three) times a week.     ibuprofen (ADVIL) 800 MG tablet Take 800 mg by mouth every 8 (eight) hours as needed for moderate pain (as needed).     pantoprazole (PROTONIX) 40 MG tablet Take 40 mg by mouth daily.     Semaglutide (OZEMPIC, 0.25 OR 0.5 MG/DOSE, [place]) Inject 0.5 mg into the skin once a week.     sertraline (ZOLOFT) 50 MG tablet Take 50 mg by mouth daily.     spironolactone (ALDACTONE) 25 MG tablet Take 25 mg by mouth daily.      valsartan (DIOVAN) 320 MG tablet Take 320 mg by mouth daily.      No facility-administered medications prior to visit.     Review of Systems: As mentioned in  HPI  Constitutional:   No  weight loss, night sweats,  Fevers, chills, fatigue, or  lassitude.  HEENT:   No headaches,  Difficulty swallowing,  Tooth/dental problems, or  Sore throat,                No sneezing, itching, ear ache, nasal congestion, post nasal drip,   CV:  No chest pain,  Orthopnea, PND, swelling in lower extremities, anasarca, dizziness, palpitations, syncope.   GI  No heartburn, indigestion, abdominal pain, nausea, vomiting, diarrhea, change in bowel habits, loss of appetite, bloody stools.   Resp: No shortness of breath with exertion or at rest.  No excess mucus, no productive cough,  No non-productive cough,  No coughing up of blood.  No change in color of mucus.  No wheezing.  No chest wall deformity  Skin: no rash or lesions.  GU: no dysuria, change in color of urine, no urgency or frequency.  No flank pain, no hematuria   MS:  No joint pain or swelling.  No decreased range of motion.  No back pain.    Physical Exam  BP (!) 145/72   Pulse 88   Ht 5' 4 (1.626 m)   Wt 181 lb 9.6 oz (82.4 kg)   SpO2 96%   BMI 31.17 kg/m   GEN: A/Ox3; pleasant , NAD, well nourished    HEENT:  [place]/AT,  EACs-clear, TMs-wnl, NOSE-clear, THROAT-clear, no lesions, no postnasal drip or exudate noted.   NECK:  Supple w/ fair ROM; no JVD; normal carotid impulses w/o bruits; no thyromegaly or nodules palpated; no lymphadenopathy.    RESP  Clear  P & A; w/o, wheezes/ rales/ or rhonchi. no accessory muscle use, no dullness to percussion  CARD:  RRR, no m/r/g, no peripheral edema, pulses intact, no cyanosis or clubbing.  GI:   Soft & nt; nml bowel sounds; no organomegaly or masses detected.   Musco: Warm bil, no deformities or joint swelling noted.   Neuro: alert, no focal deficits noted.    Skin: Warm, no lesions or rashes    Lab Results:  CBC    Component Value Date/Time   WBC 5.7 10/16/2023 0955   RBC 4.50 10/16/2023 0955   HGB 13.5 10/16/2023 0955   HCT 39.1  10/16/2023 0955   PLT 224 10/16/2023 0955   MCV 86.9 10/16/2023 0955   MCV 90 09/20/2021 0000   MCH 30.0 10/16/2023 0955   MCHC 34.5 10/16/2023 0955   RDW 13.4 10/16/2023 0955   LYMPHSABS 1.2 10/16/2023 0955   MONOABS 0.4 10/16/2023 0955   EOSABS 0.1 10/16/2023 0955   BASOSABS 0.0 10/16/2023 0955    BMET  Component Value Date/Time   NA 140 10/16/2023 0955   NA 139 09/20/2021 0000   K 4.0 10/16/2023 0955   CL 105 10/16/2023 0955   CO2 22 10/16/2023 0955   GLUCOSE 155 (H) 10/16/2023 0955   BUN 18 10/16/2023 0955   BUN 19 09/20/2021 0000   CREATININE 0.81 10/16/2023 0955   CALCIUM 10.2 10/16/2023 0955   GFRNONAA >60 10/16/2023 0955    BNP No results found for: BNP  ProBNP No results found for: PROBNP  Imaging: No results found.  Administration History     None          Latest Ref Rng & Units 01/28/2024    8:46 AM  PFT Results  FVC-Pre L 2.46   FVC-Predicted Pre % 85   FVC-Post L 2.34   FVC-Predicted Post % 80   Pre FEV1/FVC % % 84   Post FEV1/FCV % % 87   FEV1-Pre L 2.07   FEV1-Predicted Pre % 95   FEV1-Post L 2.03   DLCO uncorrected ml/min/mmHg 15.01   DLCO UNC% % 77   DLCO corrected ml/min/mmHg 15.01   DLCO COR %Predicted % 77   DLVA Predicted % 99   TLC L 3.97   TLC % Predicted % 78   RV % Predicted % 74     No results found for: NITRICOXIDE   Assessment & Plan:   Assessment & Plan Shortness of breath - Mild restrictive component seen on pulmonary function testing likely secondary to chronic back issues, weight, and elevated right hemidiaphragm. -This is improved per the patient which is likely secondary to weight loss and improvement in chronic back issues -No further intervention at this time Abnormal CT of the chest - Plan for follow-up CT as scheduled on 05/17/2024 -Keep follow-up as scheduled with Dr. Shelah on 06/10/2024.   Return if symptoms worsen or fail to improve, for CT results;  keep appt with Dr. Shelah for  06/10/2024.  Candis Dandy, PA-C 04/28/2024

## 2024-04-28 NOTE — Assessment & Plan Note (Signed)
-   Plan for follow-up CT as scheduled on 05/17/2024 -Keep follow-up as scheduled with Dr. Shelah on 06/10/2024.

## 2024-04-28 NOTE — Assessment & Plan Note (Signed)
-   Mild restrictive component seen on pulmonary function testing likely secondary to chronic back issues, weight, and elevated right hemidiaphragm. -This is improved per the patient which is likely secondary to weight loss and improvement in chronic back issues -No further intervention at this time

## 2024-04-28 NOTE — Patient Instructions (Signed)
 Complete follow up chest CT as ordered for September 2025  Follow up with Dr. Shelah as scheduled on 06/10/2024.  May use incentive spirometry:  OTC at [facility] or pharmacy.  10 breaths three times a day.  Exercise as tolerated.

## 2024-05-13 ENCOUNTER — Encounter: Payer: Self-pay | Admitting: Oncology

## 2024-05-13 ENCOUNTER — Inpatient Hospital Stay: Attending: Oncology | Admitting: Oncology

## 2024-05-13 ENCOUNTER — Inpatient Hospital Stay

## 2024-05-13 VITALS — BP 129/58 | HR 78 | Temp 98.0°F | Resp 16 | Ht 64.0 in | Wt 180.8 lb

## 2024-05-13 DIAGNOSIS — Z85038 Personal history of other malignant neoplasm of large intestine: Secondary | ICD-10-CM | POA: Insufficient documentation

## 2024-05-13 DIAGNOSIS — Z79899 Other long term (current) drug therapy: Secondary | ICD-10-CM | POA: Diagnosis not present

## 2024-05-13 DIAGNOSIS — Z801 Family history of malignant neoplasm of trachea, bronchus and lung: Secondary | ICD-10-CM | POA: Insufficient documentation

## 2024-05-13 DIAGNOSIS — C187 Malignant neoplasm of sigmoid colon: Secondary | ICD-10-CM | POA: Diagnosis not present

## 2024-05-13 DIAGNOSIS — Z85820 Personal history of malignant melanoma of skin: Secondary | ICD-10-CM | POA: Insufficient documentation

## 2024-05-13 DIAGNOSIS — Z7985 Long-term (current) use of injectable non-insulin antidiabetic drugs: Secondary | ICD-10-CM | POA: Diagnosis not present

## 2024-05-13 DIAGNOSIS — M8589 Other specified disorders of bone density and structure, multiple sites: Secondary | ICD-10-CM | POA: Diagnosis not present

## 2024-05-13 DIAGNOSIS — Z7983 Long term (current) use of bisphosphonates: Secondary | ICD-10-CM | POA: Insufficient documentation

## 2024-05-13 DIAGNOSIS — Z853 Personal history of malignant neoplasm of breast: Secondary | ICD-10-CM | POA: Insufficient documentation

## 2024-05-13 DIAGNOSIS — E119 Type 2 diabetes mellitus without complications: Secondary | ICD-10-CM | POA: Diagnosis not present

## 2024-05-13 LAB — CEA (ACCESS): CEA (CHCC): 6.78 ng/mL — ABNORMAL HIGH (ref 0.00–5.00)

## 2024-05-13 NOTE — Progress Notes (Signed)
 [facility]  [address] [place],  [place]  [address] [phone]  Clinic Day: 05/13/2024  Referring physician: Carlon Mitzie SAUNDERS, FNP    CHIEF COMPLAINT:  CC: History of stage IA hormone receptor positive right breast cancer  Current Treatment:  Surveillance  HISTORY OF PRESENT ILLNESS:  Stephanie Leblanc is a 73 y.o. female with a history of stage IA (T1c N0 M0) hormone receptor positive right breast cancer diagnosed in November 2003. She was treated with lumpectomy.  Pathology revealed a 1.5 cm, grade 2, invasive carcinoma, in the background of extensive DCIS.  Sentinel node was negative.  Estrogen and progesterone receptors were positive and her 2 Neu positive.  At that time, Herceptin was not considered standard adjuvant therapy.  She received adjuvant chemotherapy with Adriamycin and Cytoxan for 4 cycles, followed by radiation to the right breast.  She then received adjuvant hormonal therapy consisting of tamoxifen for 2-1/2 years and letrozole for 2-1/2 years.  She also has osteopenia and took raloxifene for 5 years, which was discontinued in 2014.  She continues calcium and vitamin-D for her osteopenia.  ColoGuard testing was positive, so she underwent colonoscopy in June with Dr. Teressa.  Apparently, she had a polyp, which contained cancer, but was completely resected for a stage I.  She had repeat colonoscopy since that time, which did not reveal any evidence of recurrence or new polyps.  Review of her family history is negative except for 2 maternal uncles with lung cancer in their 32-70's.  She has a history of non melanoma skin cancer and sees her dermatologist yearly.  Mammogram from December 2020 was clear.  Bone density from December 2020 revealed osteoporosis of the AP spine with a T-score of -2.8.  Right femur showed a T-score of -2.0, and left femur showed a T-score of -1.9.  I recommend that we add in Fosamax 70 mg once weekly.  She notes that she underwent  colonoscopy with Dr. Teressa in August 2020, which was clear, and she will not need this repeated in 3 years.  She did have an elevated CEA of 4.9 in March 2021 at Dr. Teressa office.  This was repeated at the end of December and was stable. It did go up to 5.8 in July 2022, and was 5.6 in September. She will be due for repeat EGD and colonoscopy in August 2023.  INTERVAL HISTORY:  Stephanie Leblanc was here in February 2025 for annual follow up of her history of stage IA hormone receptor positive right breast cancer in November 2003. At that time, her CEA had increased from 5.7 to 7.01 and so she was evaluated with CT scans. Her CT chest, abdomen, and pelvis on 11/06/2023 revealed no acute findings and no evidence of recurrent or metastatic carcinoma within the chest, abdomen, or pelvis. Patient states that she feels good and has no complaints of pain. She was diagnosed with Type II diabetes and is now on Ozempic with only complaints of mild intermittent nausea. She complained of shortness of breath a few months ago and visited a pulmonologist. Her pulmonary function test completed on 01/28/2024 looked normal. The pulmonologist also ordered a Super-D CT Chest to be completed on 05/17/2024. She had a screening mammogram done on 09/25/2023 that was clear. Her DEXA scan completed on 10/09/2023 revealed arthritis in lower spine and osteopenia. Her most recent colonoscopy was completed on March 2024. Her CEA today is pending and I will call her with the results. I will see her  back in 6 months with CBC, CMP, and CEA. She denies fever, chills, night sweats, or other signs of infection. She denies cardiorespiratory and gastrointestinal issues. She  denies pain. Her appetite is okay and Her weight has decreased 1 pounds over last 2 weeks.  REVIEW OF SYSTEMS:  Review of Systems  Constitutional: Negative.  Negative for appetite change, chills, diaphoresis, fatigue, fever and unexpected weight change.  HENT:  Negative.  Negative  for hearing loss, lump/mass, mouth sores, nosebleeds, sore throat, tinnitus, trouble swallowing and voice change.   Eyes: Negative.   Respiratory:  Positive for shortness of breath (with exertion). Negative for chest tightness, cough, hemoptysis and wheezing.   Cardiovascular: Negative.  Negative for chest pain, leg swelling and palpitations.  Gastrointestinal: Negative.  Negative for abdominal distention, abdominal pain, blood in stool, constipation, diarrhea, nausea, rectal pain and vomiting.  Endocrine: Negative.   Genitourinary: Negative.  Negative for bladder incontinence, difficulty urinating, dyspareunia, dysuria, frequency, hematuria, menstrual problem, nocturia, pelvic pain, vaginal bleeding and vaginal discharge.   Musculoskeletal:  Positive for arthralgias and back pain. Negative for flank pain, gait problem, myalgias, neck pain and neck stiffness.       Sciatica pain  Skin: Negative.  Negative for itching, rash and wound.  Neurological: Negative.  Negative for dizziness, extremity weakness, gait problem, headaches, light-headedness, numbness, seizures and speech difficulty.  Hematological: Negative.  Negative for adenopathy. Does not bruise/bleed easily.  Psychiatric/Behavioral: Negative.  Negative for confusion, decreased concentration, depression, sleep disturbance and suicidal ideas. The patient is not nervous/anxious.      VITALS:  Blood pressure (!) 129/58, pulse 78, temperature 98 F (36.7 C), temperature source Oral, resp. rate 16, height 5' 4 (1.626 m), weight 180 lb 12.8 oz (82 kg), SpO2 97%.  Wt Readings from Last 3 Encounters:  05/13/24 180 lb 12.8 oz (82 kg)  04/28/24 181 lb 9.6 oz (82.4 kg)  01/14/24 186 lb 6.4 oz (84.6 kg)    Body mass index is 31.03 kg/m.  Performance status (ECOG): 1 - Symptomatic but completely ambulatory  PHYSICAL EXAM:  Physical Exam Constitutional:      General: She is not in acute distress.    Appearance: Normal appearance. She is  normal weight. She is not ill-appearing, toxic-appearing or diaphoretic.  HENT:     Head: Normocephalic and atraumatic.     Right Ear: Tympanic membrane, ear canal and external ear normal.     Left Ear: Tympanic membrane, ear canal and external ear normal.  Eyes:     General: No scleral icterus.    Extraocular Movements: Extraocular movements intact.     Conjunctiva/sclera: Conjunctivae normal.     Pupils: Pupils are equal, round, and reactive to light.  Cardiovascular:     Rate and Rhythm: Normal rate and regular rhythm.     Pulses: Normal pulses.     Heart sounds: Normal heart sounds. No murmur heard.    No friction rub. No gallop.  Pulmonary:     Effort: Pulmonary effort is normal. No respiratory distress.     Breath sounds: Normal breath sounds.  Chest:     Comments: Right breast has a faded scar in the upper inner quadrant which is well healed and a barely visible axillary scar.  No masses in either breast.  Abdominal:     General: Bowel sounds are normal. There is no distension.     Palpations: Abdomen is soft. There is no hepatomegaly, splenomegaly or mass.     Tenderness: There is  no abdominal tenderness.     Comments: Well healed scar in epigastric area with keloid formation (from prior melanoma excision)  Musculoskeletal:        General: Normal range of motion.     Cervical back: Normal range of motion and neck supple.     Right lower leg: No edema.     Left lower leg: No edema.  Lymphadenopathy:     Cervical: No cervical adenopathy.  Skin:    General: Skin is warm and dry.  Neurological:     General: No focal deficit present.     Mental Status: She is alert and oriented to person, place, and time. Mental status is at baseline.  Psychiatric:        Mood and Affect: Mood normal.        Behavior: Behavior normal.        Thought Content: Thought content normal.        Judgment: Judgment normal.     LABS:      Latest Ref Rng & Units 10/16/2023    9:55 AM  09/26/2022    3:43 PM 09/20/2021   12:00 AM  CBC  WBC 4.0 - 10.5 K/uL 5.7  5.6  5.0      Hemoglobin 12.0 - 15.0 g/dL 86.4  87.0  86.3      Hematocrit 36.0 - 46.0 % 39.1  38.7  41      Platelets 150 - 400 K/uL 224  233  252         This result is from an external source.      Latest Ref Rng & Units 10/16/2023    9:55 AM 09/26/2022    3:43 PM 09/20/2021   12:00 AM  CMP  Glucose 70 - 99 mg/dL 844  887    BUN 8 - 23 mg/dL 18  16  19       Creatinine 0.44 - 1.00 mg/dL 9.18  9.17  0.8      Sodium 135 - 145 mmol/L 140  140  139      Potassium 3.5 - 5.1 mmol/L 4.0  3.5  3.6      Chloride 98 - 111 mmol/L 105  107  106      CO2 22 - 32 mmol/L 22  24  23       Calcium 8.9 - 10.3 mg/dL 89.7  9.7  9.5      Total Protein 6.5 - 8.1 g/dL 7.1  7.3    Total Bilirubin 0.0 - 1.2 mg/dL 0.3  0.2    Alkaline Phos 38 - 126 U/L 59  44  53      AST 15 - 41 U/L 27  23  29       ALT 0 - 44 U/L 23  22  29          This result is from an external source.   Lab Results  Component Value Date   CEA 6.78 (H) 05/13/2024     STUDIES:  EXAM: 11/06/2023 CT CHEST, ABDOMEN, AND PELVIS WITH CONTRAST IMPRESSION: No acute findings. No evidence of recurrent or metastatic carcinoma within the chest, abdomen, or pelvis.  Exam: 10/09/2023 Bone Density Scan Impression: Right femur with a T-score of -1.80. Left femur with a T-score of -2.00. AP L3-L4 with a T-score of -1.50 which has been stable. Right total femur with a T-score of -1.00 previously -1.20.   Exam: 09/25/2023 3D Screening Mammogram with CAD Impression: No mammographic evidence of malignancy.  Exam: 09/25/2023 Breast Arterial Calcification (BAC) Impression: Present unilaterally, identified in one breast and recommended follow-up for further evaluation.    Allergies:  Allergies  Allergen Reactions   Prochlorperazine Other (See Comments)    Other reaction(s): Muscle Cramps  prochlorperazine  Made muscles draw up   Compazine  [Prochlorperazine Edisylate]     Muscle contractions    Current Medications: Current Outpatient Medications  Medication Sig Dispense Refill   Semaglutide (OZEMPIC, 0.25 OR 0.5 MG/DOSE, [place]) Inject 0.5 mg into the skin once a week. (Patient taking differently: Inject 0.5 mg into the skin once a week. Starting next week up to 1mg  weekly)     alendronate (FOSAMAX) 70 MG tablet TAKE ONE TABLET once weekly 12 tablet 1   Calcium Carbonate-Vit D-Min (CALCIUM 1200 PO) Take 1,200 mg by mouth daily.      cholecalciferol (VITAMIN D) 1000 units tablet Take 1,000 Units by mouth daily.     ezetimibe (ZETIA) 10 MG tablet Take 10 mg by mouth 3 (three) times a week.     ibuprofen (ADVIL) 800 MG tablet Take 800 mg by mouth every 8 (eight) hours as needed for moderate pain (as needed).     pantoprazole (PROTONIX) 40 MG tablet Take 40 mg by mouth daily.     sertraline (ZOLOFT) 50 MG tablet Take 50 mg by mouth daily.     spironolactone (ALDACTONE) 25 MG tablet Take 25 mg by mouth daily.      valsartan (DIOVAN) 320 MG tablet Take 320 mg by mouth daily.      No current facility-administered medications for this visit.   ASSESSMENT & PLAN:  Assessment:   1. Remote history of stage IA hormone and her 2 Neu receptor positive breast cancer.  She is 22 years postop and remains without evidence of recurrence.  2. Stage I colon cancer.  She underwent colonoscopy in August, 2020 and March, 2024 and was advised to have this repeated in 3-5 years.  3. History of melanoma of the trunk many years ago. She also has had multiple other non melanoma skin cancers throughout the years. She will be seeing the dermatologist regarding some lesions of her left face. She sees the dermatologist annually for skin screening.  4. Osteoporosis, she is currently on calcium and vitamin-D, and well as Fosamax 70 mg once weekly.  Her latest bone density scan done in February 2023 shows significant improvement and she now has osteopenia. Her  DEXA scan completed on 10/09/2023 revealed arthritis in lower spine and osteopenia, with a T score of -2.0 of the left femur and -1.5 of the spine.  5.  Mildly elevated CEA, fairly stable over a 3 year period.  This was 5.8 in 2022, 5.6 in 2023, 5.7 in 2024, went up to 7.01 in February 2025. Her CT chest, abdomen, and pelvis on 11/06/2023 revealed no acute findings and no evidence of recurrent or metastatic carcinoma within the chest, abdomen, or pelvis. We will continue to monitor this.  Plan: Her CEA had increased from 5.7 to 7.01 in February 2025 and so she was evaluated with CT scans. Her CT chest, abdomen, and pelvis on 11/06/2023 revealed no acute findings and no evidence of recurrent or metastatic carcinoma within the chest, abdomen, or pelvis. Patient states that she feels good and has no complaints of pain. She was diagnosed with Type II diabetes and is now on Ozempic with only complaints of mild intermittent nausea. She complained of shortness of breath a few months ago and  visited a pulmonologist. Her pulmonary function test completed on 01/28/2024 looked normal. The pulmonologist also ordered a Super-D CT Chest to be completed on 05/17/2024. She had a screening mammogram done on 09/25/2023 that was clear. Her DEXA scan completed on 10/09/2023 revealed arthritis in lower spine and osteopenia. Her most recent colonoscopy was completed on March 2024. Her CEA today is pending and I will call her with the results. I will see her back in 6 months with CBC, CMP, and CEA. The patient understands the plans discussed today and is in agreement with them.  She knows to contact our office if she develops concerns regarding her breast cancer.  I provided 12 minutes of face-to-face time during this this encounter and > 50% was spent counseling as documented under my assessment and plan.   Stephanie VEAR Cornish, MD  [facility] A DE[facility] OF [facility] [address] [place] [address] Dept: [phone] Dept Fax: [phone]   No orders of the defined types were placed in this encounter.  I,Sarahmarie Leavey H Elen Acero,acting as a scribe for Stephanie VEAR Cornish, MD.,have documented all relevant documentation on the behalf of Stephanie VEAR Cornish, MD,as directed by  Stephanie VEAR Cornish, MD while in the presence of Stephanie VEAR Cornish, MD.

## 2024-05-17 ENCOUNTER — Ambulatory Visit
Admission: RE | Admit: 2024-05-17 | Discharge: 2024-05-17 | Disposition: A | Discharge status: Home / Self Care | Source: Ambulatory Visit | Attending: Emergency Medicine | Admitting: Emergency Medicine

## 2024-05-17 ENCOUNTER — Telehealth: Payer: Self-pay

## 2024-05-17 DIAGNOSIS — R9389 Abnormal findings on diagnostic imaging of other specified body structures: Secondary | ICD-10-CM

## 2024-05-17 NOTE — Telephone Encounter (Signed)
Called patient and informed her of lab results

## 2024-05-17 NOTE — Telephone Encounter (Signed)
-----   Message from Wanda VEAR Cornish sent at 05/14/2024  7:14 PM EDT ----- Regarding: call Tell her CEA a little better this time so will just monitor it

## 2024-06-09 ENCOUNTER — Ambulatory Visit: Admitting: Emergency Medicine

## 2024-06-09 ENCOUNTER — Encounter: Payer: Self-pay | Admitting: Emergency Medicine

## 2024-06-09 VITALS — BP 113/73 | HR 89 | Temp 98.4°F | Ht 64.0 in | Wt 177.0 lb

## 2024-06-09 DIAGNOSIS — R9389 Abnormal findings on diagnostic imaging of other specified body structures: Secondary | ICD-10-CM

## 2024-06-09 DIAGNOSIS — R0602 Shortness of breath: Secondary | ICD-10-CM | POA: Diagnosis not present

## 2024-06-09 DIAGNOSIS — K219 Gastro-esophageal reflux disease without esophagitis: Secondary | ICD-10-CM | POA: Diagnosis not present

## 2024-06-09 NOTE — Patient Instructions (Signed)
 We reviewed your CT scan of the chest today.  This shows stable small pulmonary nodules going back to 2019.  There is also stable elevation of your right hemidiaphragm with some underinflated lung.  All of these findings are stable and benign.  Good news. We reviewed your pulmonary function testing.  No evidence of abnormal airflow. Continue to work on your exercise and conditioning. Please call if you develop any new breathing symptoms, respiratory problems so we can see you to review.

## 2024-06-09 NOTE — Assessment & Plan Note (Signed)
 Slightly worse since she got on Ozempic but she is treating this with PPI.

## 2024-06-09 NOTE — Assessment & Plan Note (Signed)
 Her pulmonary function testing does not show obstruction.  Most consistent with restriction.  Discussed with her today that suspect her dyspnea is due to some deconditioning, her weight, etc.  She does feel better than last visit.  If she has progressive symptoms then we we will follow-up with her.

## 2024-06-09 NOTE — Progress Notes (Signed)
 Subjective:    Patient ID: Stephanie Leblanc, female    DOB: Apr 22, 1951, 73 y.o.   MRN: [id number]  HPI  ROV 06/09/2024 --follow-up visit 73 year old never smoker with history of breast cancer, colon cancer, melanoma.  Have seen her in the past for an abnormal CT scan of the chest with small pulmonary nodules and some pleural thickening, as well as exertional shortness of breath.  She had pulmonary function testing done 01/28/2024 that showed some evidence for restriction likely due to chronic back issues, weight, elevated right hemidiaphragm.  Most recent CT chest done 05/17/2024 to evaluate some posterior inferior right lung atelectatic change. She does still have some exertional SOB. 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 She cares for her husband who has dementia. She does have some reflux. No real cough or CP.   CT scan of the chest 05/17/2024 reviewed by me showed micronodular disease bilaterally that has been stable dating back to 2019 consistent with benign process.  Some postradiation change in the subpleural right middle lobe and partial subsegmental atelectasis with air bronchograms in the posterior right middle lobe   Review of Systems As per HPI  Past Medical History:  Diagnosis Date   Abnormal CT of the chest 04/23/2018   Anxiety    Arthritis    knee left-on meds   Breast cancer (HCC) 2003   RIGHT side   Cataract    not a surgical candidate at this time 11/06/2022   Colon cancer (HCC) 2019   sigmoid   Dyspnea 04/19/2019   Elevated CEA 10/22/2023   Elevated cholesterol 2019   on meds   GERD (gastroesophageal reflux disease)    on meds   HTN (hypertension) 01/19/2018   Hx of breast cancer 01/19/2018   2003     Hypertension 2003   on meds     Kidney stones    2 stones in the past   Low back pain 05/29/2021   Lymphedema of right upper extremity 09/11/2020   Malignant melanoma (HCC) 09/02/1998   Malignant neoplasm of sigmoid colon (HCC)    Malignant tumor of  breast (HCC) 09/02/2001   Osteopenia 12/13/2019   Osteoporosis    borderline -on meds   Spondylolisthesis of lumbar region 08/21/2021     Family History  Problem Relation Age of Onset   Heart disease Mother    Diabetes Mother    Colon polyps Mother 17   Heart disease Father    Esophageal cancer Neg Hx    Rectal cancer Neg Hx    Stomach cancer Neg Hx      Social History   Socioeconomic History   Marital status: Married    Spouse name: Not on file   Number of children: Not on file   Years of education: Not on file   Highest education level: Not on file  Occupational History   Not on file  Tobacco Use   Smoking status: Never   Smokeless tobacco: Never  Vaping Use   Vaping status: Never Used  Substance and Sexual Activity   Alcohol use: Never   Drug use: Never   Sexual activity: Not on file  Other Topics Concern   Not on file  Social History Narrative   Not on file   Social Drivers of Health   Financial Resource Strain: Not on file  Food Insecurity: Low Risk  (02/05/2024)   Received from [facility]   Hunger Vital Sign    Within the past  12 months, you worried that your food would run out before you got money to buy more: Never true    Within the past 12 months, the food you bought just didn't last and you didn't have money to get more. : Never true  Transportation Needs: No Transportation Needs (02/05/2024)   Received from [facility]    In the past 12 months, has lack of reliable transportation kept you from medical appointments, meetings, work or from getting things needed for daily living? : No  Physical Activity: Not on file  Stress: Not on file  Social Connections: Not on file  Intimate Partner Violence: Not on file     Allergies  Allergen Reactions   Prochlorperazine Other (See Comments)    Other reaction(s): Muscle Cramps  prochlorperazine  Made muscles draw up   Compazine [Prochlorperazine Edisylate]     Muscle contractions      Outpatient Medications Prior to Visit  Medication Sig Dispense Refill   alendronate (FOSAMAX) 70 MG tablet TAKE ONE TABLET once weekly 12 tablet 1   Calcium Carbonate-Vit D-Min (CALCIUM 1200 PO) Take 1,200 mg by mouth daily.      cholecalciferol (VITAMIN D) 1000 units tablet Take 1,000 Units by mouth daily.     ezetimibe (ZETIA) 10 MG tablet Take 10 mg by mouth 3 (three) times a week.     ibuprofen (ADVIL) 800 MG tablet Take 800 mg by mouth every 8 (eight) hours as needed for moderate pain (as needed).     pantoprazole (PROTONIX) 40 MG tablet Take 40 mg by mouth daily.     Semaglutide (OZEMPIC, 0.25 OR 0.5 MG/DOSE, [place]) Inject 1 mg into the skin once a week.     sertraline (ZOLOFT) 50 MG tablet Take 50 mg by mouth daily.     spironolactone (ALDACTONE) 25 MG tablet Take 25 mg by mouth daily.      valsartan (DIOVAN) 320 MG tablet Take 320 mg by mouth daily.      No facility-administered medications prior to visit.         Objective:   Physical Exam Vitals:   06/09/24 1451  BP: 113/73  Pulse: 89  Temp: 98.4 F (36.9 C)  SpO2: 95%  Weight: 177 lb (80.3 kg)  Height: 5' 4 (1.626 m)   Gen: Pleasant, well-nourished, in no distress,  normal affect  ENT: No lesions,  mouth clear,  oropharynx clear, no postnasal drip  Neck: No JVD, no stridor  Lungs: No use of accessory muscles, no crackles or wheezing on normal respiration, no wheeze on forced expiration  Cardiovascular: RRR, heart sounds normal, no murmur or gallops, no peripheral edema  Musculoskeletal: No deformities, no cyanosis or clubbing  Neuro: alert, awake, non focal  Skin: Warm, no lesions or rash      Assessment & Plan:   GERD (gastroesophageal reflux disease) Slightly worse since she got on Ozempic but she is treating this with PPI.  Abnormal CT of the chest Pulmonary nodules and some inferior right middle lobe atelectatic change.  All are stable on serial imaging.  The nodules are stable going back to  2019 and can be deemed benign.  Reassured her about this.  She does not need any more dedicated follow-up CTs unless there is a clinical change  Dyspnea Her pulmonary function testing does not show obstruction.  Most consistent with restriction.  Discussed with her today that suspect her dyspnea is due to some deconditioning, her weight, etc.  She  does feel better than last visit.  If she has progressive symptoms then we we will follow-up with her.      Lamar Chris, MD, PhD 06/09/2024, 3:18 PM [facility] [phone] or if no answer before 7:00PM call [phone] For any issues after 7:00PM please call eLink [phone]

## 2024-06-09 NOTE — Assessment & Plan Note (Addendum)
 Pulmonary nodules and some inferior right middle lobe atelectatic change.  All are stable on serial imaging.  The nodules are stable going back to 2019 and can be deemed benign.  Reassured her about this.  She does not need any more dedicated follow-up CTs unless there is a clinical change

## 2024-06-10 ENCOUNTER — Ambulatory Visit: Admitting: Emergency Medicine

## 2024-10-15 ENCOUNTER — Inpatient Hospital Stay: Payer: Medicare HMO | Admitting: Oncology

## 2024-10-15 ENCOUNTER — Inpatient Hospital Stay: Payer: Medicare HMO
